# Patient Record
Sex: Female | Born: 1980 | Race: White | Hispanic: Yes | Smoking: Never smoker
Health system: Southern US, Community
[De-identification: ages and names within clinical notes are randomized; demographics above are authoritative.]

## PROBLEM LIST (undated history)

## (undated) ENCOUNTER — Inpatient Hospital Stay (HOSPITAL_COMMUNITY): Payer: Self-pay

## (undated) DIAGNOSIS — R519 Headache, unspecified: Secondary | ICD-10-CM

## (undated) DIAGNOSIS — E119 Type 2 diabetes mellitus without complications: Secondary | ICD-10-CM

## (undated) DIAGNOSIS — F32A Depression, unspecified: Secondary | ICD-10-CM

## (undated) DIAGNOSIS — Z789 Other specified health status: Secondary | ICD-10-CM

## (undated) HISTORY — DX: Other specified health status: Z78.9

---

## 2003-06-14 ENCOUNTER — Inpatient Hospital Stay (HOSPITAL_COMMUNITY): Admission: AD | Admit: 2003-06-14 | Discharge: 2003-06-16 | Payer: Self-pay | Admitting: *Deleted

## 2003-06-14 ENCOUNTER — Encounter (INDEPENDENT_AMBULATORY_CARE_PROVIDER_SITE_OTHER): Payer: Self-pay | Admitting: Specialist

## 2003-12-24 ENCOUNTER — Inpatient Hospital Stay (HOSPITAL_COMMUNITY): Admission: AD | Admit: 2003-12-24 | Discharge: 2003-12-24 | Payer: Self-pay | Admitting: Obstetrics & Gynecology

## 2003-12-29 ENCOUNTER — Inpatient Hospital Stay (HOSPITAL_COMMUNITY): Admission: AD | Admit: 2003-12-29 | Discharge: 2003-12-29 | Payer: Self-pay | Admitting: *Deleted

## 2004-04-06 ENCOUNTER — Encounter: Admission: RE | Admit: 2004-04-06 | Discharge: 2004-04-06 | Payer: Self-pay | Admitting: *Deleted

## 2004-04-21 ENCOUNTER — Encounter: Admission: RE | Admit: 2004-04-21 | Discharge: 2004-04-21 | Payer: Self-pay | Admitting: Family Medicine

## 2004-04-27 ENCOUNTER — Encounter: Admission: RE | Admit: 2004-04-27 | Discharge: 2004-04-27 | Payer: Self-pay | Admitting: *Deleted

## 2004-05-04 ENCOUNTER — Ambulatory Visit: Payer: Self-pay | Admitting: *Deleted

## 2004-05-11 ENCOUNTER — Ambulatory Visit: Payer: Self-pay | Admitting: Family Medicine

## 2004-05-18 ENCOUNTER — Ambulatory Visit: Payer: Self-pay | Admitting: *Deleted

## 2004-05-25 ENCOUNTER — Ambulatory Visit: Payer: Self-pay | Admitting: Obstetrics & Gynecology

## 2004-05-27 ENCOUNTER — Ambulatory Visit: Payer: Self-pay | Admitting: Obstetrics & Gynecology

## 2004-06-01 ENCOUNTER — Ambulatory Visit: Payer: Self-pay | Admitting: *Deleted

## 2004-06-08 ENCOUNTER — Ambulatory Visit (HOSPITAL_COMMUNITY): Admission: RE | Admit: 2004-06-08 | Discharge: 2004-06-08 | Payer: Self-pay | Admitting: *Deleted

## 2004-06-08 ENCOUNTER — Ambulatory Visit: Payer: Self-pay | Admitting: *Deleted

## 2004-06-10 ENCOUNTER — Ambulatory Visit: Payer: Self-pay | Admitting: Obstetrics and Gynecology

## 2004-06-16 ENCOUNTER — Ambulatory Visit: Payer: Self-pay | Admitting: Family Medicine

## 2004-06-23 ENCOUNTER — Ambulatory Visit: Payer: Self-pay | Admitting: *Deleted

## 2004-06-27 ENCOUNTER — Ambulatory Visit: Payer: Self-pay | Admitting: Obstetrics and Gynecology

## 2004-06-29 ENCOUNTER — Ambulatory Visit: Payer: Self-pay | Admitting: Family Medicine

## 2004-06-29 ENCOUNTER — Inpatient Hospital Stay (HOSPITAL_COMMUNITY): Admission: AD | Admit: 2004-06-29 | Discharge: 2004-06-30 | Payer: Self-pay | Admitting: *Deleted

## 2005-04-16 ENCOUNTER — Emergency Department (HOSPITAL_COMMUNITY): Admission: EM | Admit: 2005-04-16 | Discharge: 2005-04-16 | Payer: Self-pay | Admitting: Emergency Medicine

## 2009-08-08 ENCOUNTER — Emergency Department (HOSPITAL_COMMUNITY): Admission: EM | Admit: 2009-08-08 | Discharge: 2009-08-08 | Payer: Self-pay | Admitting: Emergency Medicine

## 2010-11-29 LAB — URINALYSIS, ROUTINE W REFLEX MICROSCOPIC
Glucose, UA: NEGATIVE mg/dL
Hgb urine dipstick: NEGATIVE
Ketones, ur: >80 mg/dL — AB
Nitrite: NEGATIVE
Protein, ur: 30 mg/dL — AB
Specific Gravity, Urine: 1.033 — ABNORMAL HIGH (ref 1.005–1.030)
Urobilinogen, UA: 0.2 mg/dL (ref 0.0–1.0)
pH: 6 (ref 5.0–8.0)

## 2010-11-29 LAB — PREGNANCY, URINE: Preg Test, Ur: NEGATIVE

## 2010-11-29 LAB — URINE MICROSCOPIC-ADD ON

## 2016-09-05 ENCOUNTER — Encounter (HOSPITAL_COMMUNITY): Payer: Self-pay

## 2016-09-05 ENCOUNTER — Emergency Department (HOSPITAL_COMMUNITY)
Admission: EM | Admit: 2016-09-05 | Discharge: 2016-09-06 | Disposition: A | Discharge status: Home / Self Care | Payer: Self-pay | Attending: Emergency Medicine | Admitting: Emergency Medicine

## 2016-09-05 DIAGNOSIS — O200 Threatened abortion: Secondary | ICD-10-CM | POA: Insufficient documentation

## 2016-09-05 DIAGNOSIS — Z3A08 8 weeks gestation of pregnancy: Secondary | ICD-10-CM | POA: Insufficient documentation

## 2016-09-05 LAB — I-STAT CHEM 8, ED
BUN: 12 mg/dL (ref 6–20)
Calcium, Ion: 1.19 mmol/L (ref 1.15–1.40)
Chloride: 103 mmol/L (ref 101–111)
Creatinine, Ser: 0.5 mg/dL (ref 0.44–1.00)
Glucose, Bld: 98 mg/dL (ref 65–99)
HCT: 33 % — ABNORMAL LOW (ref 36.0–46.0)
Hemoglobin: 11.2 g/dL — ABNORMAL LOW (ref 12.0–15.0)
Potassium: 4 mmol/L (ref 3.5–5.1)
Sodium: 134 mmol/L — ABNORMAL LOW (ref 135–145)
TCO2: 24 mmol/L (ref 0–100)

## 2016-09-05 LAB — TYPE AND SCREEN
ABO/RH(D): B POS
Antibody Screen: NEGATIVE

## 2016-09-05 LAB — HCG, QUANTITATIVE, PREGNANCY: hCG, Beta Chain, Quant, S: 143304 m[IU]/mL — ABNORMAL HIGH (ref <5)

## 2016-09-05 NOTE — ED Triage Notes (Signed)
Onset 7p pt felt wet, went to BR, sat on toilet and large amount of bright red blood with small clots noted.  C/o intermittant dizziness and headache.  Last intercourse, 08-22-16.

## 2016-09-05 NOTE — ED Notes (Signed)
MD at bedside. 

## 2016-09-05 NOTE — Discharge Instructions (Signed)
You were seen in the ED today with a threatened miscarriage. This means that a miscarriage may occur but you may also go on to have a normal pregnancy. Follow up with your OB/Gyn this week.   Return to the ED with any worsening bleeding, abdominal pain, or fainting.

## 2016-09-05 NOTE — ED Provider Notes (Signed)
Emergency Department Provider Note   I have reviewed the triage vital signs and the nursing notes.   HISTORY  Chief Complaint Vaginal Bleeding   HPI Nicole Little is a 36 y.o. female G5P3 who is [redacted] weeks pregnant resents to the emergency department for evaluation of sudden onset vaginal bleeding and clot passage. Symptoms began at 7 PM. She presented to the emergency department shortly afterwards after placing a pad. She is unsure if she is still bleeding but has not had to change the pattern is not felt any no fluid. She does describe some pressure in her pelvis. She did have a prior miscarriage with her first pregnancy but none since. Her only other symptom is a mild left-sided headache that occurred 2 days ago and resolved with over-the-counter medications.    History reviewed. No pertinent past medical history.  There are no active problems to display for this patient.   History reviewed. No pertinent surgical history.  Current Outpatient Rx  . Order #: [id number] Class: Historical Med    Allergies Penicillins  History reviewed. No pertinent family history.  Social History Social History  Substance Use Topics  . Smoking status: Never Smoker  . Smokeless tobacco: Never Used  . Alcohol use No    Review of Systems  Constitutional: No fever/chills Eyes: No visual changes. ENT: No sore throat. Cardiovascular: Denies chest pain. Respiratory: Denies shortness of breath. Gastrointestinal: No abdominal pain.  No nausea, no vomiting.  No diarrhea.  No constipation. Genitourinary: Negative for dysuria. Positive vaginal bleeding and pelvic pressure.  Musculoskeletal: Negative for back pain. Skin: Negative for rash. Neurological: Negative for headaches, focal weakness or numbness.  10-point ROS otherwise negative.  ____________________________________________   PHYSICAL EXAM:  VITAL SIGNS: ED Triage Vitals  Enc Vitals Group     BP 09/05/16 1940 119/62     Pulse Rate 09/05/16 1940 80     Resp 09/05/16 1940 16     Temp 09/05/16 1940 98.3 F (36.8 C)     Temp Source 09/05/16 1940 Oral     SpO2 09/05/16 1940 99 %     Pain Score 09/05/16 1943 5   Constitutional: Alert and oriented. Well appearing and in no acute distress. Eyes: Conjunctivae are normal. Head: Atraumatic. Nose: No congestion/rhinnorhea. Mouth/Throat: Mucous membranes are moist.  Oropharynx non-erythematous. Neck: No stridor.   Cardiovascular: Normal rate, regular rhythm. Good peripheral circulation. Grossly normal heart sounds.   Respiratory: Normal respiratory effort.  No retractions. Lungs CTAB. Gastrointestinal: Soft and nontender. No distention.  Genitourinary: Small amount of dark blood in vaginal vault. Cervix is visually closed. No tissue or clot visualized.  Musculoskeletal: No lower extremity tenderness nor edema. No gross deformities of extremities. Neurologic:  Normal speech and language. No gross focal neurologic deficits are appreciated.  Skin:  Skin is warm, dry and intact. No rash noted.  ____________________________________________   LABS (all labs ordered are listed, but only abnormal results are displayed)  Labs Reviewed  HCG, QUANTITATIVE, PREGNANCY - Abnormal; Notable for the following:       Result Value   hCG, Beta Chain, Quant, S 143,304 (*)    All other components within normal limits  I-STAT CHEM 8, ED - Abnormal; Notable for the following:    Sodium 134 (*)    Hemoglobin 11.2 (*)    HCT 33.0 (*)    All other components within normal limits  TYPE AND SCREEN  ABO/RH   ____________________________________________   PROCEDURES  Procedure(s) performed:  Procedures  EMERGENCY DEPARTMENT [place] PREGNANCY "Study: Limited Ultrasound of the Pelvis for Pregnancy"  INDICATIONS:Pregnancy(required) and Vaginal bleeding Multiple views of the uterus and pelvic cavity were obtained in real-time with a multi-frequency  probe.  APPROACH:Transabdominal   PERFORMED BY: Myself  IMAGES ARCHIVED?: Yes  LIMITATIONS: Body habitus  PREGNANCY FREE FLUID: None  ADNEXAL FINDINGS:Left ovary not seen and Right ovary not seen  PREGNANCY FINDINGS: Fetal pole present and Fetal heart activity seen  INTERPRETATION: Viable intrauterine pregnancy  GESTATIONAL AGE, ESTIMATE: 8 wks  CPT Codes:  40981-19 (transabdominal OB)   ____________________________________________   INITIAL IMPRESSION / ASSESSMENT AND PLAN / ED COURSE  Pertinent labs & imaging results that were available during my care of the patient were reviewed by me and considered in my medical decision making (see chart for details).  Patient who is G5 P3 [redacted] weeks pregnant presents with sudden onset vaginal bleeding with small amount of clots. She is in no acute distress with no tenderness to palpation of the abdomen. Bedside ultrasound shows an intrauterine pregnancy is approximately [redacted] weeks gestational age with positive fetal heart activity.   Patient has intrauterine pregnancy on bedside ultrasound with fetal heart activity. At this time she seems to have a threatened miscarriage. Discussed OB/GYN follow-up and return precautions in detail.  At this time, I do not feel there is any life-threatening condition present. I have reviewed and discussed all results (EKG, imaging, lab, urine as appropriate), exam findings with patient. I have reviewed nursing notes and appropriate previous records.  I feel the patient is safe to be discharged home without further emergent workup. Discussed usual and customary return precautions. Patient and family (if present) verbalize understanding and are comfortable with this plan.  Patient will follow-up with their primary care provider. If they do not have a primary care provider, information for follow-up has been provided to them. All questions have been answered.  ____________________________________________  FINAL  CLINICAL IMPRESSION(S) / ED DIAGNOSES  Final diagnoses:  Threatened miscarriage     MEDICATIONS GIVEN DURING THIS VISIT:  None  NEW OUTPATIENT MEDICATIONS STARTED DURING THIS VISIT:  None   Note:  This document was prepared using Dragon voice recognition software and may include unintentional dictation errors.  Alona Bene, MD Emergency Medicine   Maia Plan, MD 09/06/16 [phone]

## 2016-09-06 LAB — ABO/RH: ABO/RH(D): B POS

## 2016-09-12 ENCOUNTER — Emergency Department (HOSPITAL_COMMUNITY)
Admission: EM | Admit: 2016-09-12 | Discharge: 2016-09-12 | Disposition: A | Discharge status: Home / Self Care | Payer: Self-pay | Attending: Emergency Medicine | Admitting: Emergency Medicine

## 2016-09-12 DIAGNOSIS — O039 Complete or unspecified spontaneous abortion without complication: Secondary | ICD-10-CM | POA: Insufficient documentation

## 2016-09-12 LAB — CBC
HCT: 34.5 % — ABNORMAL LOW (ref 36.0–46.0)
Hemoglobin: 11.7 g/dL — ABNORMAL LOW (ref 12.0–15.0)
MCH: 29.5 pg (ref 26.0–34.0)
MCHC: 33.9 g/dL (ref 30.0–36.0)
MCV: 87.1 fL (ref 78.0–100.0)
Platelets: 225 10*3/uL (ref 150–400)
RBC: 3.96 MIL/uL (ref 3.87–5.11)
RDW: 13 % (ref 11.5–15.5)
WBC: 8.6 10*3/uL (ref 4.0–10.5)

## 2016-09-12 LAB — HCG, QUANTITATIVE, PREGNANCY: hCG, Beta Chain, Quant, S: 86509 m[IU]/mL — ABNORMAL HIGH (ref <5)

## 2016-09-12 NOTE — ED Notes (Signed)
ED Provider at bedside. 

## 2016-09-12 NOTE — ED Triage Notes (Signed)
Was seen here last week for vaginal bleeding.  [facility].  Is here for blood recheck.

## 2016-09-12 NOTE — ED Provider Notes (Signed)
[facility]-EMERGENCY DEPT Provider Note   CSN: [id number] Arrival date & time: 09/12/16  [id number]     History   Chief Complaint Chief Complaint  Patient presents with  . Follow-up    HPI Nicole Little is a 36 y.o. female.  Patient presents to the emergency department with chief complaint of needing repeat labs. She states that she was seen 1 week ago for vaginal bleeding during pregnancy. He was discharged with instructions to [facility] the next [facility], however she did not do this. She states that the bleeding has since stopped. She denies any abdominal pain. She reports having a mild amount of clear discharge from the vagina. Denies any dysuria. Denies any other associated symptoms.   The history is provided by the patient. No language interpreter was used.    No past medical history on file.  There are no active problems to display for this patient.   No past surgical history on file.  OB History    Gravida Para Term Preterm AB Living   1             SAB TAB Ectopic Multiple Live Births                   Home Medications    Prior to Admission medications   Medication Sig Start Date End Date Taking? Authorizing Provider  ferrous sulfate 325 (65 FE) MG tablet Take 325 mg by mouth daily with breakfast.    Historical Provider, MD    Family History No family history on file.  Social History Social History  Substance Use Topics  . Smoking status: Never Smoker  . Smokeless tobacco: Never Used  . Alcohol use No     Allergies   Penicillins   Review of Systems Review of Systems  All other systems reviewed and are negative.    Physical Exam Updated Vital Signs BP 109/60 (BP Location: Left Arm)   Pulse 77   Wt 59 kg   LMP 06/20/2016   SpO2 99%   Physical Exam  Constitutional: She is oriented to person, place, and time. She appears well-developed and well-nourished.  HENT:  Head: Normocephalic and atraumatic.  Eyes: Conjunctivae  and EOM are normal. Pupils are equal, round, and reactive to light.  Neck: Normal range of motion. Neck supple.  Cardiovascular: Normal rate and regular rhythm.  Exam reveals no gallop and no friction rub.   No murmur heard. Pulmonary/Chest: Effort normal and breath sounds normal. No respiratory distress. She has no wheezes. She has no rales. She exhibits no tenderness.  Abdominal: Soft. Bowel sounds are normal. She exhibits no distension and no mass. There is no tenderness. There is no rebound and no guarding.  No focal abdominal tenderness, no RLQ tenderness or pain at McBurney's point, no RUQ tenderness or Murphy's sign, no left-sided abdominal tenderness, no fluid wave, or signs of peritonitis   Musculoskeletal: Normal range of motion. She exhibits no edema or tenderness.  Neurological: She is alert and oriented to person, place, and time.  Skin: Skin is warm and dry.  Psychiatric: She has a normal mood and affect. Her behavior is normal. Judgment and thought content normal.  Nursing note and vitals reviewed.    ED Treatments / Results  Labs (all labs ordered are listed, but only abnormal results are displayed) Labs Reviewed  HCG, QUANTITATIVE, PREGNANCY - Abnormal; Notable for the following:       Result Value   hCG, Beta Chain,  [name] 16,109 (*)    All other components within normal limits  CBC - Abnormal; Notable for the following:    Hemoglobin 11.7 (*)    HCT 34.5 (*)    All other components within normal limits    EKG  EKG Interpretation None       Radiology No results found.  Procedures Procedures (including critical care time)  Medications Ordered in ED Medications - No data to display   Initial Impression / Assessment and Plan / ED Course  I have reviewed the triage vital signs and the nursing notes.  Pertinent labs & imaging results that were available during my care of the patient were reviewed by me and considered in my medical decision making (see  chart for details).  Clinical Course     Patient seen for vaginal bleeding in pregnancy last week. She was advised to follow-up at [facility] in one day for repeat testing. Ultrasound from a week ago did show an intrauterine pregnancy (see prior ED note). We will need to repeat the quantitative hCG. She has not had anymore bleeding or any abdominal pain.  HCG is trending down.  I discussed the findings with Dr. Jolayne Panther, who states that no further emergency department testing needed.  [facility] will schedule an appointment with the patient.     Final Clinical Impressions(s) / ED Diagnoses   Final diagnoses:  Miscarriage    New Prescriptions New Prescriptions   No medications on file     Roxy Horseman, PA-C 09/12/16 1119    Jerelyn Scott, MD 09/12/16 1122

## 2016-09-28 ENCOUNTER — Ambulatory Visit (INDEPENDENT_AMBULATORY_CARE_PROVIDER_SITE_OTHER): Payer: Self-pay | Admitting: Obstetrics & Gynecology

## 2016-09-28 ENCOUNTER — Other Ambulatory Visit: Payer: Self-pay | Admitting: Obstetrics & Gynecology

## 2016-09-28 ENCOUNTER — Ambulatory Visit (HOSPITAL_COMMUNITY)
Admission: RE | Admit: 2016-09-28 | Discharge: 2016-09-28 | Disposition: A | Discharge status: Home / Self Care | Payer: Self-pay | Source: Ambulatory Visit | Attending: Obstetrics & Gynecology | Admitting: Obstetrics & Gynecology

## 2016-09-28 ENCOUNTER — Encounter: Payer: Self-pay | Admitting: Obstetrics & Gynecology

## 2016-09-28 ENCOUNTER — Encounter (HOSPITAL_COMMUNITY): Payer: Self-pay

## 2016-09-28 DIAGNOSIS — O3680X0 Pregnancy with inconclusive fetal viability, not applicable or unspecified: Secondary | ICD-10-CM

## 2016-09-28 DIAGNOSIS — O209 Hemorrhage in early pregnancy, unspecified: Secondary | ICD-10-CM

## 2016-09-28 DIAGNOSIS — Z3A14 14 weeks gestation of pregnancy: Secondary | ICD-10-CM | POA: Insufficient documentation

## 2016-09-28 DIAGNOSIS — Z3A15 15 weeks gestation of pregnancy: Secondary | ICD-10-CM

## 2016-09-28 NOTE — Progress Notes (Signed)
Ultrasounds Results Note  SUBJECTIVE HPI:  Ms. Nicole Little is a 36 y.o. [id number]  Patient's last menstrual period was 06/20/2016. who [facility] for followup ultrasound results. The patient denies abdominal pain or vaginal bleeding. Results for Nicole Little, Nicole Little (MRN [id number]) as of 09/28/2016 14:05  Ref. Range 09/12/2016 09:09  HCG, Beta Chain, Quant, S Latest Ref Range: <5 mIU/mL 86,509 (H)  [facility] and [place] confirmed viable 10 week pregnancy Upon review of the patient's records, patient was first seen in ED on 1/9 for bleeding.   BHCG on that day was 143,304.  Bedside Ultrasound by the provider showed 8 week viable pregnancy by his note.  Last seen on 1/16. BHCG was [id number] .  NO vaginal bleeding or tissue passage   No past medical history on file. No past surgical history on file. Social History   Social History  . Marital status: Married    Spouse name: N/A  . Number of children: N/A  . Years of education: N/A   Occupational History  . Not on file.   Social History Main Topics  . Smoking status: Never Smoker  . Smokeless tobacco: Never Used  . Alcohol use No  . Drug use: No  . Sexual activity: Not on file   Other Topics Concern  . Not on file   Social History Narrative  . No narrative on file   Current Outpatient Prescriptions on File Prior to Visit  Medication Sig Dispense Refill  . ferrous sulfate 325 (65 FE) MG tablet Take 325 mg by mouth daily with breakfast.    . Prenatal Vit-Fe Fumarate-FA (MULTIVITAMIN-PRENATAL) 27-0.8 MG TABS tablet Take 1 tablet by mouth daily at 12 noon.     No current facility-administered medications on file prior to visit.    Allergies  Allergen Reactions  . Penicillins Rash    I have reviewed patient's Past Medical Hx, Surgical Hx, Family Hx, Social Hx, medications and allergies.   Review of Systems Review of Systems  Constitutional: Negative for fever and  chills.  Gastrointestinal: Negative for nausea, vomiting, abdominal pain, diarrhea and constipation.  Genitourinary: Negative for dysuria.  Musculoskeletal: Negative for back pain.  Neurological: Negative for dizziness and weakness.    Physical Exam  BP 107/62   Pulse 69   Wt 133 lb 6.4 oz (60.5 kg)   LMP 06/20/2016   GENERAL: Well-developed, well-nourished female in no acute distress.  HEENT: Normocephalic, atraumatic.   LUNGS: Effort normal ABDOMEN: soft, non-tender HEART: Regular rate  SKIN: Warm, dry and without erythema PSYCH: Normal mood and affect NEURO: Alert and oriented x 4  LAB RESULTS No results found for this or any previous visit (from the past 24 hour(s)).  IMAGING  EMERGENCY DEPARTMENT US PREGNANCY "Study: Limited Ultrasound of the Pelvis for Pregnancy"  INDICATIONS:Pregnancy(required) and Vaginal bleeding Multiple views of the uterus and pelvic cavity were obtained in real-time with a multi-frequency probe.  APPROACH:Transabdominal   PERFORMED BY: Myself  IMAGES ARCHIVED?: Yes  LIMITATIONS: Body habitus  PREGNANCY FREE FLUID: None  ADNEXAL FINDINGS:Left ovary not seen and Right ovary not seen  PREGNANCY FINDINGS: Fetal pole present and Fetal heart activity seen  INTERPRETATION: Viable intrauterine pregnancy  GESTATIONAL AGE, ESTIMATE: 8 wks  CPT Codes:  95621-3076815-26 (transabdominal OB)  ASSESSMENT 1. First trimester bleeding     Resolved, viable 15 week pregnancy by [place] today PLAN Schedule transfer of care per her request  Adam Phenix, MD  09/28/2016  2:02 PM

## 2016-10-05 ENCOUNTER — Ambulatory Visit (INDEPENDENT_AMBULATORY_CARE_PROVIDER_SITE_OTHER): Payer: Self-pay | Admitting: Obstetrics and Gynecology

## 2016-10-05 ENCOUNTER — Encounter: Payer: Self-pay | Admitting: Obstetrics and Gynecology

## 2016-10-05 DIAGNOSIS — O09299 Supervision of pregnancy with other poor reproductive or obstetric history, unspecified trimester: Secondary | ICD-10-CM | POA: Insufficient documentation

## 2016-10-05 DIAGNOSIS — Z8759 Personal history of other complications of pregnancy, childbirth and the puerperium: Secondary | ICD-10-CM | POA: Insufficient documentation

## 2016-10-05 DIAGNOSIS — O0990 Supervision of high risk pregnancy, unspecified, unspecified trimester: Secondary | ICD-10-CM | POA: Insufficient documentation

## 2016-10-05 DIAGNOSIS — O09292 Supervision of pregnancy with other poor reproductive or obstetric history, second trimester: Secondary | ICD-10-CM

## 2016-10-05 DIAGNOSIS — Z113 Encounter for screening for infections with a predominantly sexual mode of transmission: Secondary | ICD-10-CM

## 2016-10-05 LAB — POCT URINALYSIS DIP (DEVICE)
Bilirubin Urine: NEGATIVE
Glucose, UA: NEGATIVE mg/dL
Hgb urine dipstick: NEGATIVE
Ketones, ur: NEGATIVE mg/dL
Nitrite: NEGATIVE
Protein, ur: NEGATIVE mg/dL
Specific Gravity, Urine: 1.02 (ref 1.005–1.030)
Urobilinogen, UA: 0.2 mg/dL (ref 0.0–1.0)
pH: 5.5 (ref 5.0–8.0)

## 2016-10-05 NOTE — Patient Instructions (Signed)
Segundo trimestre de embarazo (Second Trimester of Pregnancy) El segundo trimestre va desde la semana13 hasta la 28, desde el cuarto hasta el sexto mes, y suele ser el momento en el que mejor se siente. Su organismo se ha adaptado a estar embarazada y comienza a sentirse fsicamente mejor. En general, las nuseas matutinas han disminuido o han desaparecido completamente, puede tener ms energa y un aumento de apetito. El segundo trimestre es tambin la poca en la que el feto se desarrolla rpidamente. Hacia el final del sexto mes, el feto mide aproximadamente 9pulgadas (23cm) y pesa alrededor de 1 libras (700g). Es probable que sienta que el beb se mueve (da pataditas) entre las 18 y 20semanas del embarazo. CAMBIOS EN EL ORGANISMO Su organismo atraviesa por muchos cambios durante el embarazo, y estos varan de una mujer a otra.  Seguir aumentando de peso. Notar que la parte baja del abdomen sobresale.  Podrn aparecer las primeras estras en las caderas, el abdomen y las mamas.  Es posible que tenga dolores de cabeza que pueden aliviarse con los medicamentos que el mdico le permita tomar.  Tal vez tenga necesidad de orinar con ms frecuencia porque el feto est ejerciendo presin sobre la vejiga.  Debido al embarazo podr sentir acidez estomacal con frecuencia.  Puede estar estreida, ya que ciertas hormonas enlentecen los movimientos de los msculos que empujan los desechos a travs de los intestinos.  Pueden aparecer hemorroides o abultarse e hincharse las venas (venas varicosas).  Puede tener dolor de espalda que se debe al aumento de peso y a que las hormonas del embarazo relajan las articulaciones entre los huesos de la pelvis, y como consecuencia de la modificacin del peso y los msculos que mantienen el equilibrio.  Las mamas seguirn creciendo y le dolern.  Las encas pueden sangrar y estar sensibles al cepillado y al hilo dental.  Pueden aparecer zonas oscuras o  manchas (cloasma, mscara del embarazo) en el rostro que probablemente se atenuar despus del nacimiento del beb.  Es posible que se forme una lnea oscura desde el ombligo hasta la zona del pubis (linea nigra) que probablemente se atenuar despus del nacimiento del beb.  Tal vez haya cambios en el cabello que pueden incluir su engrosamiento, crecimiento rpido y cambios en la textura. Adems, a algunas mujeres se les cae el cabello durante o despus del embarazo, o tienen el cabello seco o fino. Lo ms probable es que el cabello se le normalice despus del nacimiento del beb. QU DEBE ESPERAR EN LAS CONSULTAS PRENATALES Durante una visita prenatal de rutina:  La pesarn para asegurarse de que usted y el feto estn creciendo normalmente.  Le tomarn la presin arterial.  Le medirn el abdomen para controlar el desarrollo del beb.  Se escucharn los latidos cardacos fetales.  Se evaluarn los resultados de los estudios solicitados en visitas anteriores. El mdico puede preguntarle lo siguiente:  Cmo se siente.  Si siente los movimientos del beb.  Si ha tenido sntomas anormales, como prdida de lquido, sangrado, dolores de cabeza intensos o clicos abdominales.  Si est consumiendo algn producto que contenga tabaco, como cigarrillos, tabaco de mascar y cigarrillos electrnicos.  Si tiene alguna pregunta. Otros estudios que podrn realizarse durante el segundo trimestre incluyen lo siguiente:  Anlisis de sangre para detectar lo siguiente: ? Concentraciones de hierro bajas (anemia). ? Diabetes gestacional (entre la semana 24 y la 28). ? Anticuerpos Rh.  Anlisis de orina para detectar infecciones, diabetes o protenas en la orina.    Una ecografa para confirmar que el beb crece y se desarrolla correctamente.  Una amniocentesis para diagnosticar posibles problemas genticos.  Estudios del feto para descartar espina bfida y sndrome de Down.  Prueba del VIH (virus  de inmunodeficiencia humana). Los exmenes prenatales de rutina incluyen la prueba de deteccin del VIH, a menos que decida no realizrsela. INSTRUCCIONES PARA EL CUIDADO EN EL HOGAR  Evite fumar, consumir hierbas, beber alcohol y tomar frmacos que no le hayan recetado. Estas sustancias qumicas afectan la formacin y el desarrollo del beb.  No consuma ningn producto que contenga tabaco, lo que incluye cigarrillos, tabaco de mascar y cigarrillos electrnicos. Si necesita ayuda para dejar de fumar, consulte al mdico. Puede recibir asesoramiento y otro tipo de recursos para dejar de fumar.  Siga las indicaciones del mdico en relacin con el uso de medicamentos. Durante el embarazo, hay medicamentos que son seguros de tomar y otros que no.  Haga ejercicio solamente como se lo haya indicado el mdico. Sentir clicos uterinos es un buen signo para detener la actividad fsica.  Contine comiendo alimentos sanos con regularidad.  Use un sostn que le brinde buen soporte si le duelen las mamas.  No se d baos de inmersin en agua caliente, baos turcos ni saunas.  Use el cinturn de seguridad en todo momento mientras conduce.  No coma carne cruda ni queso sin cocinar; evite el contacto con las bandejas sanitarias de los gatos y la tierra que estos animales usan. Estos elementos contienen grmenes que pueden causar defectos congnitos en el beb.  Tome las vitaminas prenatales.  Tome entre 1500 y 2000mg de calcio diariamente comenzando en la semana20 del embarazo hasta el parto.  Si est estreida, pruebe un laxante suave (si el mdico lo autoriza). Consuma ms alimentos ricos en fibra, como vegetales y frutas frescos y cereales integrales. Beba gran cantidad de lquido para mantener la orina de tono claro o color amarillo plido.  Dese baos de asiento con agua tibia para aliviar el dolor o las molestias causadas por las hemorroides. Use una crema para las hemorroides si el mdico la  autoriza.  Si tiene venas varicosas, use medias de descanso. Eleve los pies durante 15minutos, 3 o 4veces por da. Limite el consumo de sal en su dieta.  No levante objetos pesados, use zapatos de tacones bajos y mantenga una buena postura.  Descanse con las piernas elevadas si tiene calambres o dolor de cintura.  Visite a su dentista si an no lo ha hecho durante el embarazo. Use un cepillo de dientes blando para higienizarse los dientes y psese el hilo dental con suavidad.  Puede seguir manteniendo relaciones sexuales, a menos que el mdico le indique lo contrario.  Concurra a todas las visitas prenatales segn las indicaciones de su mdico.  SOLICITE ATENCIN MDICA SI:  Tiene mareos.  Siente clicos leves, presin en la pelvis o dolor persistente en el abdomen.  Tiene nuseas, vmitos o diarrea persistentes.  Observa una secrecin vaginal con mal olor.  Siente dolor al orinar.  SOLICITE ATENCIN MDICA DE INMEDIATO SI:  Tiene fiebre.  Tiene una prdida de lquido por la vagina.  Tiene sangrado o pequeas prdidas vaginales.  Siente dolor intenso o clicos en el abdomen.  Sube o baja de peso rpidamente.  Tiene dificultad para respirar y siente dolor de pecho.  Sbitamente se le hinchan mucho el rostro, las manos, los tobillos, los pies o las piernas.  No ha sentido los movimientos del beb durante una hora.    Siente un dolor de cabeza intenso que no se alivia con medicamentos.  Su visin se modifica.  Esta informacin no tiene como fin reemplazar el consejo del mdico. Asegrese de hacerle al mdico cualquier pregunta que tenga. Document Released: 05/24/2005 Document Revised: 09/04/2014 Document Reviewed: 10/15/2012 Elsevier Interactive Patient Education  2017 [employer]  Eleccin del mtodo anticonceptivo (Contraception Choices) La anticoncepcin (control de la natalidad) es el uso de cualquier mtodo o dispositivo para evitar el embarazo. A  continuacin se indican algunos de esos mtodos. MTODOS HORMONALES  El Implante contraconceptivo consiste en un tubo plstico delgado que contiene la hormona progesterona. No contiene estrgenos. El mdico inserta el tubo en la parte interna del brazo. El tubo puede permanecer en el lugar durante 3 aos. Despus de los 3 aos debe retirarse. El implante impide que los ovarios liberen vulos (ovulacin), espesa el moco cervical, lo que evita que los espermatozoides ingresen al tero y hace ms delgada la membrana que cubre el interior del tero.  Inyecciones de progesterona sola: las administra el mdico cada 3 meses para evitar el embarazo. La progesterona sinttica impide que los ovarios liberen vulos. Tambin hacen que el moco cervical se espese y modifique el tejido de recubrimiento interno del tero. Esto hace ms difcil que los espermatozoides sobrevivan en el tero.  Las pldoras anticonceptivas contienen estrgenos y progesterona. Su funcin es evitar que los ovarios liberen vulos (ovulacin). Las hormonas de los anticonceptivos orales hacen que el moco cervical se haga ms espeso, lo que evita que el esperma ingrese al tero. Las pldoras anticonceptivas son recetadas por el mdico.Tambin se utilizan para tratar los perodos menstruales abundantes.  Minipldora: este tipo de pldora anticonceptiva contiene slo hormona progesterona. Deben tomarse todos los das del mes y debe recetarlas el mdico.  El parche de control de natalidad: contiene hormonas similares a las que contienen las pldoras anticonceptivas. Deben cambiarse una vez por semana y se utilizan bajo prescripcin mdica.  Anillo vaginal: contiene hormonas similares a las que contienen las pldoras anticonceptivas. Se deja colocado durante tres semanas, se lo retira durante 1 semana y luego se coloca uno nuevo. La paciente debe sentirse cmoda al insertar y retirar el anillo de la vagina.Es necesaria la prescripcin  mdica.  Anticonceptivos de emergencia: son mtodos para evitar un embarazo despus de una relacin sexual sin proteccin. Esta pldora puede tomarse inmediatamente despus de tener relaciones sexuales o hasta 5 das de haber tenido sexo sin proteccin. Es ms efectiva si se toma poco tiempo despus de la relacin sexual. Los anticonceptivos de emergencia estn disponibles sin prescripcin mdica. Consltelo con su farmacutico. No use los anticonceptivos de emergencia como nico mtodo anticonceptivo.  MTODOS DE BARRERA  Condn masculino: es una vaina delgada (ltex o goma) que se coloca cubriendo al pene durante el acto sexual. Puede usarse con espermicida para aumentar la efectividad.  Condn femenino. Es una funda delicada y blanda que se adapta holgadamente a la vagina antes de las relaciones sexuales.  Diafragma: es una barrera de ltex redonda y suave que debe ser recomendado por un profesional. Se inserta en la vagina, junto con un gel espermicida. Debe insertarse antes de tener relaciones sexuales. Debe dejar el diafragma colocado en la vagina durante 6 a 8 horas despus de la relacin sexual.  Capuchn cervical: es una barrera de ltex o taza plstica redonda y suave que cubre el cuello del tero y debe ser colocada por un mdico. Puede dejarlo colocado en la vagina hasta 48 horas despus de   las relaciones sexuales.  Esponja: es una pieza blanda y circular de espuma de poliuretano. Contiene un espermicida. Se inserta en la vagina despus de mojarla y antes de las relaciones sexuales.  Espermicidas: son sustancias qumicas que matan o bloquean al esperma y no lo dejan ingresar al cuello del tero y al tero. Vienen en forma de cremas, geles, supositorios, espuma o comprimidos. No es necesario tener receta mdica. Se insertan en la vagina con un aplicador antes de tener relaciones sexuales. El proceso debe repetirse cada vez que tiene relaciones sexuales.  ANTICONCEPTIVOS  INTRAUTERINOS  Dispositivo intrauterino (DIU) es un dispositivo en forma de T que se coloca en el tero durante el perodo menstrual, para evitar el embarazo. Hay dos tipos: ? DIU de cobre: este tipo de DIU est recubierto con un alambre de cobre y se inserta dentro del tero. El cobre hace que el tero y las trompas de Falopio produzcan un liquido que destruye los espermatozoides. Puede permanecer colocado durante 10 aos. ? DIU con hormona: este tipo de DIU contiene la hormona progestina (progesterona sinttica). La hormona espesa el moco cervical y evita que los espermatozoides ingresen al tero y tambin afina la membrana que cubre el tero para evitar la implantacin del vulo fertilizado. La hormona debilita o destruye los espermatozoides que ingresan al tero. Puede permanecer en el lugar durante 3-5 aos, segn el tipo de DIU que se utilice.  MTODOS ANTICONCEPTIVOS PERMANENTES  Ligadura de trompas en la mujer: se realiza sellando, atando u obstruyendo quirrgicamente las trompas de Falopio lo que impide que el vulo descienda hacia el tero.  Esterilizacin histeroscpica: Implica la colocacin de un pequeo espiral o la insercin en cada trompa de Falopio. El mdico utiliza una tcnica llamada histeroscopa para realizar este procedimiento. El dispositivo produce la formacin de tejido cicatrizal. Esto da como resultado una obstruccin permanente de las trompas de Falopio, de modo que la esperma no pueda fertilizar el vulo. Demora alrededor de 3 meses despus del procedimiento hasta que el conducto se obstruye. Tendr que usar otro mtodo anticonceptivo durante al menos 3 meses.  Esterilizacin masculina: se realiza ligando los conductos por los que pasan los espermatozoides (vasectoma).Esto impide que el esperma ingrese a la vagina durante el acto sexual. Luego del procedimiento, el hombre puede eyacular lquido (semen).  MTODOS DE PLANIFICACIN NATURAL  Planificacin familiar natural:  consiste en no tener relaciones sexuales o usar un mtodo de barrera (condn, diafragma, capuchn cervical) en los das que la mujer podra quedar embarazada.  Mtodo de calendario: consiste en el seguimiento de la duracin de cada ciclo menstrual y la identificacin de los perodos frtiles.  Mtodo de ovulacin: consiste en evitar las relaciones sexuales durante la ovulacin.  Mtodo sintotrmico: consiste en evitar las relaciones sexuales en la poca en la que se est ovulando, utilizando un termmetro y tendiendo en cuenta los sntomas de la ovulacin.  Mtodo postovulacin: consiste en planificar las relaciones sexuales para despus de haber ovulado. Independientemente del tipo o mtodo anticonceptivo que usted elija, es importante que use condones para protegerse contra las infecciones de transmisin sexual (ETS). Hable con su mdico con respecto a qu mtodo anticonceptivo es el ms apropiado para usted. Esta informacin no tiene como fin reemplazar el consejo del mdico. Asegrese de hacerle al mdico cualquier pregunta que tenga. Document Released: 08/14/2005 Document Revised: 04/16/2013 Document Reviewed: 02/06/2013 Elsevier Interactive Patient Education  2017 [employer] Inc[employer]   Lactancia materna (Breastfeeding) Decidir amamantar es una de las mejores elecciones que puede hacer   por usted y su beb. El cambio hormonal durante el embarazo produce el desarrollo del tejido mamario y aumenta la cantidad y el tamao de los conductos galactforos. Estas hormonas tambin permiten que las protenas, los azcares y las grasas de la sangre produzcan la leche materna en las glndulas productoras de leche. Las hormonas impiden que la leche materna sea liberada antes del nacimiento del beb, adems de impulsar el flujo de leche luego del nacimiento. Una vez que ha comenzado a amamantar, pensar en el beb, as como la succin o el llanto, pueden estimular la liberacin de leche de las glndulas productoras  de leche. LOS BENEFICIOS DE AMAMANTAR Para el beb  La primera leche (calostro) ayuda a mejorar el funcionamiento del sistema digestivo del beb.  La leche tiene anticuerpos que ayudan a prevenir las infecciones en el beb.  El beb tiene una menor incidencia de asma, alergias y del sndrome de muerte sbita del lactante.  Los nutrientes en la leche materna son mejores para el beb que la leche maternizada y estn preparados exclusivamente para cubrir las necesidades del beb.  La leche materna mejora el desarrollo cerebral del beb.  Es menos probable que el beb desarrolle otras enfermedades, como obesidad infantil, asma o diabetes mellitus de tipo 2. Para usted  La lactancia materna favorece el desarrollo de un vnculo muy especial entre la madre y el beb.  Es conveniente. La leche materna siempre est disponible a la temperatura correcta y es econmica.  La lactancia materna ayuda a quemar caloras y a perder el peso ganado durante el embarazo.  Favorece la contraccin del tero al tamao que tena antes del embarazo de manera ms rpida y disminuye el sangrado (loquios) despus del parto.  La lactancia materna contribuye a reducir el riesgo de desarrollar diabetes mellitus de tipo 2, osteoporosis o cncer de mama o de ovario en el futuro. SIGNOS DE QUE EL BEB EST HAMBRIENTO Primeros signos de hambre  Aumenta su estado de alerta o actividad.  Se estira.  Mueve la cabeza de un lado a otro.  Mueve la cabeza y abre la boca cuando se le toca la mejilla o la comisura de la boca (reflejo de bsqueda).  Aumenta las vocalizaciones, tales como sonidos de succin, se relame los labios, emite arrullos, suspiros, o chirridos.  Mueve la mano hacia la boca.  Se chupa con ganas los dedos o las manos. Signos tardos de hambre  Est agitado.  Llora de manera intermitente. Signos de hambre extrema Los signos de hambre extrema requerirn que lo calme y lo consuele antes de que el  beb pueda alimentarse adecuadamente. No espere a que se manifiesten los siguientes signos de hambre extrema para comenzar a amamantar:  Agitacin.  Llanto intenso y fuerte.  Gritos. INFORMACIN BSICA SOBRE LA LACTANCIA MATERNA Iniciacin de la lactancia materna  Encuentre un lugar cmodo para sentarse o acostarse, con un buen respaldo para el cuello y la espalda.  Coloque una almohada o una manta enrollada debajo del beb para acomodarlo a la altura de la mama (si est sentada). Las almohadas para amamantar se han diseado especialmente a fin de servir de apoyo para los brazos y el beb mientras amamanta.  Asegrese de que el abdomen del beb est frente al suyo.  Masajee suavemente la mama. Con las yemas de los dedos, masajee la pared del pecho hacia el pezn en un movimiento circular. Esto estimula el flujo de leche. Es posible que deba continuar este movimiento mientras amamanta si la   leche fluye lentamente.  Sostenga la mama con el pulgar por arriba del pezn y los otros 4 dedos por debajo de la mama. Asegrese de que los dedos se encuentren lejos del pezn y de la boca del beb.  Empuje suavemente los labios del beb con el pezn o con el dedo.  Cuando la boca del beb se abra lo suficiente, acrquelo rpidamente a la mama e introduzca todo el pezn y la zona oscura que lo rodea (areola), tanto como sea posible, dentro de la boca del beb. ? Debe haber ms areola visible por arriba del labio superior del beb que por debajo del labio inferior. ? La lengua del beb debe estar entre la enca inferior y la mama.  Asegrese de que la boca del beb est en la posicin correcta alrededor del pezn (prendida). Los labios del beb deben crear un sello sobre la mama y estar doblados hacia afuera (invertidos).  Es comn que el beb succione durante 2 a 3 minutos para que comience el flujo de leche materna. Cmo debe prenderse Es muy importante que le ensee al beb cmo prenderse  adecuadamente a la mama. Si el beb no se prende adecuadamente, puede causarle dolor en el pezn y reducir la produccin de leche materna, y hacer que el beb tenga un escaso aumento de peso. Adems, si el beb no se prende adecuadamente al pezn, puede tragar aire durante la alimentacin. Esto puede causarle molestias al beb. Hacer eructar al beb al cambiar de mama puede ayudarlo a liberar el aire. Sin embargo, ensearle al beb cmo prenderse a la mama adecuadamente es la mejor manera de evitar que se sienta molesto por tragar aire mientras se alimenta. Signos de que el beb se ha prendido adecuadamente al pezn:  Tironea o succiona de modo silencioso, sin causarle dolor.  Se escucha que traga cada 3 o 4 succiones.  Hay movimientos musculares por arriba y por delante de sus odos al succionar. Signos de que el beb no se ha prendido adecuadamente al pezn:  Hace ruidos de succin o de chasquido mientras se alimenta.  Siente dolor en el pezn. Si cree que el beb no se prendi correctamente, deslice el dedo en la comisura de la boca y colquelo entre las encas del beb para interrumpir la succin. Intente comenzar a amamantar nuevamente. Signos de lactancia materna exitosa Signos del beb:  Disminuye gradualmente el nmero de succiones o cesa la succin por completo.  Se duerme.  Relaja el cuerpo.  Retiene una pequea cantidad de leche en la boca.  Se desprende solo del pecho. Signos que presenta usted:  Las mamas han aumentado la firmeza, el peso y el tamao 1 a 3 horas despus de amamantar.  Estn ms blandas inmediatamente despus de amamantar.  Un aumento del volumen de leche, y tambin un cambio en su consistencia y color se producen hacia el quinto da de lactancia materna.  Los pezones no duelen, ni estn agrietados ni sangran. Signos de que su beb recibe la cantidad de leche suficiente  Mojar por lo menos 1 o 2 paales durante las primeras 24 horas despus del  nacimiento.  Mojar por lo menos 5 o 6 paales cada 24 horas durante la primera semana despus del nacimiento. La orina debe ser transparente o de color amarillo plido a los 5 das despus del nacimiento.  Mojar entre 6 y 8 paales cada 24 horas a medida que el beb sigue creciendo y desarrollndose.  Defeca al menos 3 veces en   24 horas a los 5 das de vida. La materia fecal debe ser blanda y amarillenta.  Defeca al menos 3 veces en 24 horas a los 7 das de vida. La materia fecal debe ser grumosa y amarillenta.  No registra una prdida de peso mayor del 10% del peso al nacer durante los primeros 3 das de vida.  Aumenta de peso un promedio de 4 a 7onzas (113 a 198g) por semana despus de los 4 das de vida.  Aumenta de peso, diariamente, de manera uniforme a partir de los 5 das de vida, sin registrar prdida de peso despus de las 2semanas de vida. Despus de alimentarse, es posible que el beb regurgite una pequea cantidad. Esto es frecuente. FRECUENCIA Y DURACIN DE LA LACTANCIA MATERNA El amamantamiento frecuente la ayudar a producir ms leche y a prevenir problemas de dolor en los pezones e hinchazn en las mamas. Alimente al beb cuando muestre signos de hambre o si siente la necesidad de reducir la congestin de las mamas. Esto se denomina "lactancia a demanda". Evite el uso del chupete mientras trabaja para establecer la lactancia (las primeras 4 a 6 semanas despus del nacimiento del beb). Despus de este perodo, podr ofrecerle un chupete. Las investigaciones demostraron que el uso del chupete durante el primer ao de vida del beb disminuye el riesgo de desarrollar el sndrome de muerte sbita del lactante (SMSL). Permita que el nio se alimente en cada mama todo lo que desee. Contine amamantando al beb hasta que haya terminado de alimentarse. Cuando el beb se desprende o se queda dormido mientras se est alimentando de la primera mama, ofrzcale la segunda. Debido a que, con  frecuencia, los recin nacidos permanecen somnolientos las primeras semanas de vida, es posible que deba despertar al beb para alimentarlo. Los horarios de lactancia varan de un beb a otro. Sin embargo, las siguientes reglas pueden servir como gua para ayudarla a garantizar que el beb se alimenta adecuadamente:  Se puede amamantar a los recin nacidos (bebs de 4 semanas o menos de vida) cada 1 a 3 horas.  No deben transcurrir ms de 3 horas durante el da o 5 horas durante la noche sin que se amamante a los recin nacidos.  Debe amamantar al beb 8 veces como mnimo en un perodo de 24 horas, hasta que comience a introducir slidos en su dieta, a los 6 meses de vida aproximadamente. EXTRACCIN DE LECHE MATERNA La extraccin y el almacenamiento de la leche materna le permiten asegurarse de que el beb se alimente exclusivamente de leche materna, aun en momentos en los que no puede amamantar. Esto tiene especial importancia si debe regresar al trabajo en el perodo en que an est amamantando o si no puede estar presente en los momentos en que el beb debe alimentarse. Su asesor en lactancia puede orientarla sobre cunto tiempo es seguro almacenar leche materna. El sacaleche es un aparato que le permite extraer leche de la mama a un recipiente estril. Luego, la leche materna extrada puede almacenarse en un refrigerador o congelador. Algunos sacaleches son manuales, mientras que otros son elctricos. Consulte a su asesor en lactancia qu tipo ser ms conveniente para usted. Los sacaleches se pueden comprar; sin embargo, algunos hospitales y grupos de apoyo a la lactancia materna alquilan sacaleches mensualmente. Un asesor en lactancia puede ensearle cmo extraer leche materna manualmente, en caso de que prefiera no usar un sacaleche. CMO CUIDAR LAS MAMAS DURANTE LA LACTANCIA MATERNA Los pezones se secan, agrietan y duelen   durante la lactancia materna. Las siguientes recomendaciones pueden  ayudarla a mantener las mamas humectadas y sanas:  Evite usar jabn en los pezones.  Use un sostn de soporte. Aunque no son esenciales, las camisetas sin mangas o los sostenes especiales para amamantar estn diseados para acceder fcilmente a las mamas, para amamantar sin tener que quitarse todo el sostn o la camiseta. Evite usar sostenes con aro o sostenes muy ajustados.  Seque al aire sus pezones durante 3 a 4minutos despus de amamantar al beb.  Utilice solo apsitos de algodn en el sostn para absorber las prdidas de leche. La prdida de un poco de leche materna entre las tomas es normal.  Utilice lanolina sobre los pezones luego de amamantar. La lanolina ayuda a mantener la humedad normal de la piel. Si usa lanolina pura, no tiene que lavarse los pezones antes de volver a alimentar al beb. La lanolina pura no es txica para el beb. Adems, puede extraer manualmente algunas gotas de leche materna y masajear suavemente esa leche sobre los pezones, para que la leche se seque al aire. Durante las primeras semanas despus de dar a luz, algunas mujeres pueden experimentar hinchazn en las mamas (congestin mamaria). La congestin puede hacer que sienta las mamas pesadas, calientes y sensibles al tacto. El pico de la congestin ocurre dentro de los 3 a 5 das despus del parto. Las siguientes recomendaciones pueden ayudarla a aliviar la congestin:  Vace por completo las mamas al amamantar o extraer leche. Puede aplicar calor hmedo en las mamas (en la ducha o con toallas hmedas para manos) antes de amamantar o extraer leche. Esto aumenta la circulacin y ayuda a que la leche fluya. Si el beb no vaca por completo las mamas cuando lo amamanta, extraiga la leche restante despus de que haya finalizado.  Use un sostn ajustado (para amamantar o comn) o una camiseta sin mangas durante 1 o 2 das para indicar al cuerpo que disminuya ligeramente la produccin de leche.  Aplique compresas de  hielo sobre las mamas, a menos que le resulte demasiado incmodo.  Asegrese de que el beb est prendido y se encuentre en la posicin correcta mientras lo alimenta. Si la congestin persiste luego de 48 horas o despus de seguir estas recomendaciones, comunquese con su mdico o un asesor en lactancia. RECOMENDACIONES GENERALES PARA EL CUIDADO DE LA SALUD DURANTE LA LACTANCIA MATERNA  Consuma alimentos saludables. Alterne comidas y colaciones, y coma 3 de cada una por da. Dado que lo que come afecta la leche materna, es posible que algunas comidas hagan que su beb se vuelva ms irritable de lo habitual. Evite comer este tipo de alimentos si percibe que afectan de manera negativa al beb.  Beba leche, jugos de fruta y agua para satisfacer su sed (aproximadamente 10 vasos al da).  Descanse con frecuencia, reljese y tome sus vitaminas prenatales para evitar la fatiga, el estrs y la anemia.  Contine con los autocontroles de la mama.  Evite masticar y fumar tabaco. Las sustancias qumicas de los cigarrillos que pasan a la leche materna y la exposicin al humo ambiental del tabaco pueden daar al beb.  No consuma alcohol ni drogas, incluida la marihuana. Algunos medicamentos, que pueden ser perjudiciales para el beb, pueden pasar a travs de la leche materna. Es importante que consulte a su mdico antes de tomar cualquier medicamento, incluidos todos los medicamentos recetados y de venta libre, as como los suplementos vitamnicos y herbales. Puede quedar embarazada durante la lactancia. Si   desea controlar la natalidad, consulte a su mdico cules son las opciones ms seguras para el beb. SOLICITE ATENCIN MDICA SI:  Usted siente que quiere dejar de amamantar o se siente frustrada con la lactancia.  Siente dolor en las mamas o en los pezones.  Sus pezones estn agrietados o sangran.  Sus pechos estn irritados, sensibles o calientes.  Tiene un rea hinchada en cualquiera de las  mamas.  Siente escalofros o fiebre.  Tiene nuseas o vmitos.  Presenta una secrecin de otro lquido distinto de la leche materna de los pezones.  Sus mamas no se llenan antes de amamantar al beb para el quinto da despus del parto.  Se siente triste y deprimida.  El beb est demasiado somnoliento como para comer bien.  El beb tiene problemas para dormir.  Moja menos de 3 paales en 24 horas.  Defeca menos de 3 veces en 24 horas.  La piel del beb o la parte blanca de los ojos se vuelven amarillentas.  El beb no ha aumentado de peso a los 5 das de vida.  SOLICITE ATENCIN MDICA DE INMEDIATO SI:  El beb est muy cansado (letargo) y no se quiere despertar para comer.  Le sube la fiebre sin causa.  Esta informacin no tiene como fin reemplazar el consejo del mdico. Asegrese de hacerle al mdico cualquier pregunta que tenga. Document Released: 08/14/2005 Document Revised: 12/06/2015 Document Reviewed: 02/05/2013 Elsevier Interactive Patient Education  2017 Elsevier Inc.  

## 2016-10-05 NOTE — Progress Notes (Signed)
Pap last year at [facility].

## 2016-10-05 NOTE — Progress Notes (Signed)
  Subjective:    Nicole Little is a [id number] 5963w2d being seen today for her first obstetrical visit.  Her obstetrical history is significant for first pregnancy [facility]. Patient reports SOL at 36 weeks. She states that she was told that her child passed prior to delivery and that her child passed during the delivery process. Records have been requested. Patient had 2 subsequent uncomplicated pregnancies. Patient does intend to breast feed. Pregnancy history fully reviewed.  Patient reports no complaints.  Vitals:   10/05/16 1244 10/05/16 1246  BP: (!) 110/49   Pulse: 70   Weight: 136 lb (61.7 kg)   Height:  5\' 2"  (1.575 m)    HISTORY: OB History  Gravida Para Term Preterm AB Living  4 3 2 1  0 2  SAB TAB Ectopic Multiple Live Births  0 0 0 1 2    # Outcome Date GA Lbr Len/2nd Weight Sex Delivery Anes PTL Lv  4A Gravida           4B Current           3 Term 05/08/06 7780w0d  6 lb 7 oz (2.92 kg) F    LIV  2 Term 06/29/04 2452w0d  6 lb 10 oz (3.005 kg) M Vag-Spont   LIV  1 Preterm 06/14/03 3859w3d  4 lb (1.814 kg)     FD     Past Medical History:  Diagnosis Date  . Medical history non-contributory    History reviewed. No pertinent surgical history. Family History  Problem Relation Age of Onset  . Diabetes Mother   . Diabetes Father      Exam    Uterus:   15-weeks  Pelvic Exam:    Perineum: No Hemorrhoids, Normal Perineum   Vulva: normal   Vagina:  normal mucosa, normal discharge   pH:    Cervix: multiparous appearance   Adnexa: normal adnexa and no mass, fullness, tenderness   Bony Pelvis: gynecoid  System: Breast:  normal appearance, no masses or tenderness   Skin: normal coloration and turgor, no rashes    Neurologic: oriented, no focal deficits   Extremities: normal strength, tone, and muscle mass   HEENT extra ocular movement intact   Mouth/Teeth mucous membranes moist, pharynx normal without lesions and dental hygiene good   Neck supple and no masses   Cardiovascular: regular rate and rhythm   Respiratory:  chest clear, no wheezing, crepitations, rhonchi, normal symmetric air entry   Abdomen: soft, non-tender; bowel sounds normal; no masses,  no organomegaly   Urinary:       Assessment:    Pregnancy: [id number] Patient Active Problem List   Diagnosis Date Noted  . Supervision of high-risk pregnancy 10/05/2016  . First trimester bleeding 09/28/2016        Plan:     Initial labs drawn. Prenatal vitamins. Problem list reviewed and updated. Genetic Screening discussed Quad Screen: requested.to be done at next visit  Ultrasound discussed; fetal survey: ordered. Pap smear will be requested from health department. Patient states it was normal last year  Follow up in 4 weeks. 50% of 30 min visit spent on counseling and coordination of care.     Huel Centola 10/05/2016

## 2016-10-06 LAB — PRENATAL PROFILE (SOLSTAS)
Antibody Screen: NEGATIVE
Basophils Absolute: 0 cells/uL (ref 0–200)
Basophils Relative: 0 %
Eosinophils Absolute: 80 cells/uL (ref 15–500)
Eosinophils Relative: 1 %
HCT: 33.4 % — ABNORMAL LOW (ref 35.0–45.0)
HIV 1&2 Ab, 4th Generation: NONREACTIVE
Hemoglobin: 11 g/dL — ABNORMAL LOW (ref 11.7–15.5)
Hepatitis B Surface Ag: NEGATIVE
Lymphocytes Relative: 18 %
Lymphs Abs: 1440 cells/uL (ref 850–3900)
MCH: 29.4 pg (ref 27.0–33.0)
MCHC: 32.9 g/dL (ref 32.0–36.0)
MCV: 89.3 fL (ref 80.0–100.0)
MPV: 10.4 fL (ref 7.5–12.5)
Monocytes Absolute: 480 cells/uL (ref 200–950)
Monocytes Relative: 6 %
Neutro Abs: 6000 cells/uL (ref 1500–7800)
Neutrophils Relative %: 75 %
Platelets: 245 10*3/uL (ref 140–400)
RBC: 3.74 MIL/uL — ABNORMAL LOW (ref 3.80–5.10)
RDW: 13.5 % (ref 11.0–15.0)
Rh Type: POSITIVE
Rubella: 4.27 Index — ABNORMAL HIGH (ref <0.90)
WBC: 8 10*3/uL (ref 3.8–10.8)

## 2016-10-06 LAB — CERVICOVAGINAL ANCILLARY ONLY
Chlamydia: NEGATIVE
Neisseria Gonorrhea: NEGATIVE

## 2016-10-06 LAB — PAIN MGMT, PROFILE 6 CONF W/O MM, U
6 Acetylmorphine: NEGATIVE ng/mL (ref <10)
Alcohol Metabolites: NEGATIVE ng/mL (ref <500)
Amphetamines: NEGATIVE ng/mL (ref <500)
Barbiturates: NEGATIVE ng/mL (ref <300)
Benzodiazepines: NEGATIVE ng/mL (ref <100)
Cocaine Metabolite: NEGATIVE ng/mL (ref <150)
Creatinine: 80.7 mg/dL (ref >=20.0)
Marijuana Metabolite: NEGATIVE ng/mL (ref <20)
Methadone Metabolite: NEGATIVE ng/mL (ref <100)
Opiates: NEGATIVE ng/mL (ref <100)
Oxidant: NEGATIVE ug/mL (ref <200)
Oxycodone: NEGATIVE ng/mL (ref <100)
Phencyclidine: NEGATIVE ng/mL (ref <25)
Please note:: 0
pH: 6.64 (ref 4.5–9.0)

## 2016-10-06 LAB — HEMOGLOBIN A1C
Hgb A1c MFr Bld: 5 % (ref <5.7)
Mean Plasma Glucose: 97 mg/dL

## 2016-10-06 LAB — CULTURE, OB URINE: Organism ID, Bacteria: NO GROWTH

## 2016-10-30 ENCOUNTER — Ambulatory Visit (HOSPITAL_COMMUNITY)
Admission: RE | Admit: 2016-10-30 | Discharge: 2016-10-30 | Disposition: A | Discharge status: Home / Self Care | Payer: Self-pay | Source: Ambulatory Visit | Attending: Obstetrics & Gynecology | Admitting: Obstetrics & Gynecology

## 2016-10-30 ENCOUNTER — Other Ambulatory Visit: Payer: Self-pay | Admitting: Obstetrics & Gynecology

## 2016-10-30 DIAGNOSIS — Z3689 Encounter for other specified antenatal screening: Secondary | ICD-10-CM

## 2016-10-30 DIAGNOSIS — O09299 Supervision of pregnancy with other poor reproductive or obstetric history, unspecified trimester: Secondary | ICD-10-CM

## 2016-10-30 DIAGNOSIS — O209 Hemorrhage in early pregnancy, unspecified: Secondary | ICD-10-CM

## 2016-10-30 DIAGNOSIS — Z3A18 18 weeks gestation of pregnancy: Secondary | ICD-10-CM

## 2016-10-30 DIAGNOSIS — O09292 Supervision of pregnancy with other poor reproductive or obstetric history, second trimester: Secondary | ICD-10-CM | POA: Insufficient documentation

## 2016-11-02 ENCOUNTER — Ambulatory Visit (INDEPENDENT_AMBULATORY_CARE_PROVIDER_SITE_OTHER): Payer: Self-pay | Admitting: Obstetrics and Gynecology

## 2016-11-02 VITALS — BP 115/52 | HR 78 | Wt 141.0 lb

## 2016-11-02 DIAGNOSIS — Z789 Other specified health status: Secondary | ICD-10-CM | POA: Insufficient documentation

## 2016-11-02 DIAGNOSIS — O09299 Supervision of pregnancy with other poor reproductive or obstetric history, unspecified trimester: Secondary | ICD-10-CM

## 2016-11-02 DIAGNOSIS — O09292 Supervision of pregnancy with other poor reproductive or obstetric history, second trimester: Secondary | ICD-10-CM

## 2016-11-02 DIAGNOSIS — O0992 Supervision of high risk pregnancy, unspecified, second trimester: Secondary | ICD-10-CM

## 2016-11-02 NOTE — Progress Notes (Signed)
Spanish video interpreter "Margurite Auerbachlfredo" [phone]#[phone] Pt c/o frequent headaches

## 2016-11-02 NOTE — Progress Notes (Signed)
OB US scheduled for April 12th @ 1330.  Pt notified.

## 2016-11-02 NOTE — Progress Notes (Signed)
Prenatal Visit Note Date: 11/02/2016 Clinic: [facility]  Subjective:  Nicole Little is a 36 y.o. [phone] at 5076w2d being seen today for ongoing prenatal care.  She is currently monitored for the following issues for this high-risk pregnancy and has First trimester bleeding; Supervision of high-risk pregnancy; and History of intrauterine fetal death, currently pregnant on her problem list.  Patient reports no complaints.   Contractions: Not present. Vag. Bleeding: None.  Movement: Present. Denies leaking of fluid.   The following portions of the patient's history were reviewed and updated as appropriate: allergies, current medications, past family history, past medical history, past social history, past surgical history and problem list. Problem list updated.  Objective:   Vitals:   11/02/16 1308  BP: (!) 115/52  Pulse: 78  Weight: 141 lb (64 kg)    Fetal Status: Fetal Heart Rate (bpm): 150   Movement: Present     General:  Alert, oriented and cooperative. Patient is in no acute distress.  Skin: Skin is warm and dry. No rash noted.   Cardiovascular: Normal heart rate noted  Respiratory: Normal respiratory effort, no problems with respiration noted  Abdomen: Soft, gravid, appropriate for gestational age. Pain/Pressure: Present     Pelvic:  Cervical exam deferred        Extremities: Normal range of motion.  Edema: None  Mental Status: Normal mood and affect. Normal behavior. Normal judgment and thought content.   Urinalysis:      Assessment and Plan:  Pregnancy: [id number] at 5376w2d  1. History of intrauterine fetal death, currently pregnant Notes not available. In talking to the patient, she states that she showed up to the hospital due to labor s/s (pain, no bleeding) and she states that she was told that she had an IUFD and then was told it was a NN demise. Pt brought some notes from that time and below is a summary (full details to be scanned  in[facility]  [facility] Near term intrauterine still birth due to acute chorio assoc with mec staining of membranes. Female c/w 35-36wks with no somatic or visceral anomalies. Moderate to marked generalized autolysis. Repot states weight of 250gm but death certificate gives 4lbs 13oz as weight at 36/3 weeks.   Given the autolysis, more c/w that she had an IUFD.   At this time recommend serial growth scans and start ap testing at 32wks and consider 39wk IOL  2. Supervision of high risk pregnancy in second trimester Anatomy scan neg. Quad screen today.   Interpreter used.   Preterm labor symptoms and general obstetric precautions including but not limited to vaginal bleeding, contractions, leaking of fluid and fetal movement were reviewed in detail with the patient. Please refer to After Visit Summary for other counseling recommendations.  Return in about 3 weeks (around 11/23/2016).   [place] Bingharlie Elon Lomeli, MD

## 2016-11-08 LAB — AFP, QUAD SCREEN
DIA MOM VALUE: 0.73
DIA VALUE (EIA): 144.79 pg/mL
DSR (BY AGE) 1 IN: 318
DSR (Second Trimester) 1 IN: 6400
Gestational Age: 19.3 WEEKS
MATERNAL AGE AT EDD: 34.7 a
MSAFP Mom: 1.37
MSAFP: 72.7 ng/mL
MSHCG Mom: 0.65
MSHCG: 16609 m[IU]/mL
OSB RISK: 3920
T18 (By Age): 1:1238 {titer}
TEST RESULTS AFP: NEGATIVE
UE3 VALUE: 1.84 ng/mL
Weight: 141 [lb_av]
uE3 Mom: 1.06

## 2016-11-23 ENCOUNTER — Ambulatory Visit (INDEPENDENT_AMBULATORY_CARE_PROVIDER_SITE_OTHER): Payer: Self-pay | Admitting: Family Medicine

## 2016-11-23 VITALS — BP 105/60 | HR 78 | Wt 147.0 lb

## 2016-11-23 DIAGNOSIS — O09299 Supervision of pregnancy with other poor reproductive or obstetric history, unspecified trimester: Secondary | ICD-10-CM

## 2016-11-23 DIAGNOSIS — O0992 Supervision of high risk pregnancy, unspecified, second trimester: Secondary | ICD-10-CM

## 2016-11-23 DIAGNOSIS — O09292 Supervision of pregnancy with other poor reproductive or obstetric history, second trimester: Secondary | ICD-10-CM

## 2016-11-23 NOTE — Progress Notes (Signed)
   PRENATAL VISIT NOTE  Subjective:  Nicole Little is a 36 y.o. [phone] at 7335w2d being seen today for ongoing prenatal care.  She is currently monitored for the following issues for this high-risk pregnancy and has First trimester bleeding; Supervision of high-risk pregnancy; History of intrauterine fetal death, currently pregnant; and Language barrier on her problem list.  Patient reports no complaints.  Contractions: Not present. Vag. Bleeding: None.  Movement: Present. Denies leaking of fluid.   The following portions of the patient's history were reviewed and updated as appropriate: allergies, current medications, past family history, past medical history, past social history, past surgical history and problem list. Problem list updated.  Objective:   Vitals:   11/23/16 1240  BP: 105/60  Pulse: 78  Weight: 147 lb (66.7 kg)    Fetal Status: Fetal Heart Rate (bpm): 143   Movement: Present     General:  Alert, oriented and cooperative. Patient is in no acute distress.  Skin: Skin is warm and dry. No rash noted.   Cardiovascular: Normal heart rate noted  Respiratory: Normal respiratory effort, no problems with respiration noted  Abdomen: Soft, gravid, appropriate for gestational age. Pain/Pressure: Absent     Pelvic:  Cervical exam deferred        Extremities: Normal range of motion.  Edema: None  Mental Status: Normal mood and affect. Normal behavior. Normal judgment and thought content.   Assessment and Plan:  Pregnancy: G4P2102 at 4635w2d  1. Supervision of high risk pregnancy in second trimester FH and FHT normal  2. History of intrauterine fetal death, currently pregnant Growth scans.  Preterm labor symptoms and general obstetric precautions including but not limited to vaginal bleeding, contractions, leaking of fluid and fetal movement were reviewed in detail with the patient. Please refer to After Visit Summary for other counseling recommendations.  Return in  about 4 weeks (around 12/21/2016) for HR OB f/u.   Levie HeritageJacob J Cashton Hosley, DO

## 2016-11-23 NOTE — Progress Notes (Signed)
[employer] interpreter Efraim KaufmannMelissa [phone].

## 2016-12-07 ENCOUNTER — Ambulatory Visit (HOSPITAL_COMMUNITY)
Admission: RE | Admit: 2016-12-07 | Discharge: 2016-12-07 | Disposition: A | Discharge status: Home / Self Care | Payer: Self-pay | Source: Ambulatory Visit | Attending: Obstetrics and Gynecology | Admitting: Obstetrics and Gynecology

## 2016-12-07 ENCOUNTER — Encounter (HOSPITAL_COMMUNITY): Payer: Self-pay

## 2016-12-07 ENCOUNTER — Other Ambulatory Visit (HOSPITAL_COMMUNITY): Payer: Self-pay | Admitting: *Deleted

## 2016-12-07 DIAGNOSIS — O09292 Supervision of pregnancy with other poor reproductive or obstetric history, second trimester: Secondary | ICD-10-CM | POA: Insufficient documentation

## 2016-12-07 DIAGNOSIS — O0992 Supervision of high risk pregnancy, unspecified, second trimester: Secondary | ICD-10-CM

## 2016-12-07 DIAGNOSIS — Z3A24 24 weeks gestation of pregnancy: Secondary | ICD-10-CM | POA: Insufficient documentation

## 2016-12-07 DIAGNOSIS — O09299 Supervision of pregnancy with other poor reproductive or obstetric history, unspecified trimester: Secondary | ICD-10-CM

## 2016-12-07 DIAGNOSIS — R6252 Short stature (child): Secondary | ICD-10-CM

## 2016-12-21 ENCOUNTER — Ambulatory Visit (INDEPENDENT_AMBULATORY_CARE_PROVIDER_SITE_OTHER): Payer: Self-pay | Admitting: Obstetrics and Gynecology

## 2016-12-21 VITALS — BP 104/54 | HR 73 | Wt 152.0 lb

## 2016-12-21 DIAGNOSIS — O09292 Supervision of pregnancy with other poor reproductive or obstetric history, second trimester: Secondary | ICD-10-CM

## 2016-12-21 DIAGNOSIS — Z789 Other specified health status: Secondary | ICD-10-CM

## 2016-12-21 DIAGNOSIS — O09299 Supervision of pregnancy with other poor reproductive or obstetric history, unspecified trimester: Secondary | ICD-10-CM

## 2016-12-21 DIAGNOSIS — O0992 Supervision of high risk pregnancy, unspecified, second trimester: Secondary | ICD-10-CM

## 2016-12-21 NOTE — Progress Notes (Signed)
Prenatal Visit Note Date: 12/21/2016 Clinic: [facility]  Subjective:  Nicole Little is a 36 y.o. [phone] at [redacted]w[redacted]d being seen today for ongoing prenatal care.  She is currently monitored for the following issues for this high-risk pregnancy and has First trimester bleeding; Supervision of high-risk pregnancy; History of intrauterine fetal death, currently pregnant; and Language barrier on her problem list.  Patient reports no complaints.   Contractions: Not present. Vag. Bleeding: None.  Movement: Present. Denies leaking of fluid.   The following portions of the patient's history were reviewed and updated as appropriate: allergies, current medications, past family history, past medical history, past social history, past surgical history and problem list. Problem list updated.  Objective:   Vitals:   12/21/16 1246  BP: (!) 104/54  Pulse: 73  Weight: 152 lb (68.9 kg)    Fetal Status: Fetal Heart Rate (bpm): 160 Fundal Height: 27 cm Movement: Present     General:  Alert, oriented and cooperative. Patient is in no acute distress.  Skin: Skin is warm and dry. No rash noted.   Cardiovascular: Normal heart rate noted  Respiratory: Normal respiratory effort, no problems with respiration noted  Abdomen: Soft, gravid, appropriate for gestational age. Pain/Pressure: Present     Pelvic:  Cervical exam deferred        Extremities: Normal range of motion.  Edema: Trace  Mental Status: Normal mood and affect. Normal behavior. Normal judgment and thought content.   Urinalysis:      Assessment and Plan:  Pregnancy: [id number] at [redacted]w[redacted]d  1. Supervision of high risk pregnancy in second trimester 28wk labs nv. Pt states that she thinks that when she cuts her fingers or skin that she bleeds easily. She denies any VB or spotting. Can get coags, vWD panel with 28wk labs and rpt vWD after pregnancy.   2. History of intrauterine fetal death, currently pregnant See prior  notes. Has growth already scheduled. Continue with q4wk growths and start ap testing at 32wks. Consider 39wk iol  3. Language barrier Interpreter used  Preterm labor symptoms and general obstetric precautions including but not limited to vaginal bleeding, contractions, leaking of fluid and fetal movement were reviewed in detail with the patient. Please refer to After Visit Summary for other counseling recommendations.  Return in about 2 weeks (around 01/04/2017) for rob and 2hr GTT.   [place] Bing, MD

## 2016-12-21 NOTE — Progress Notes (Signed)
Went over the benefits of breastfeeding.

## 2017-01-04 ENCOUNTER — Other Ambulatory Visit (HOSPITAL_COMMUNITY): Payer: Self-pay | Admitting: Maternal and Fetal Medicine

## 2017-01-04 ENCOUNTER — Encounter (HOSPITAL_COMMUNITY): Payer: Self-pay

## 2017-01-04 ENCOUNTER — Ambulatory Visit (HOSPITAL_COMMUNITY)
Admission: RE | Admit: 2017-01-04 | Discharge: 2017-01-04 | Disposition: A | Discharge status: Home / Self Care | Payer: Self-pay | Source: Ambulatory Visit | Attending: Obstetrics and Gynecology | Admitting: Obstetrics and Gynecology

## 2017-01-04 DIAGNOSIS — Z3A28 28 weeks gestation of pregnancy: Secondary | ICD-10-CM | POA: Insufficient documentation

## 2017-01-04 DIAGNOSIS — Z8759 Personal history of other complications of pregnancy, childbirth and the puerperium: Secondary | ICD-10-CM | POA: Insufficient documentation

## 2017-01-04 DIAGNOSIS — R6252 Short stature (child): Secondary | ICD-10-CM

## 2017-01-04 DIAGNOSIS — Z3689 Encounter for other specified antenatal screening: Secondary | ICD-10-CM

## 2017-01-04 DIAGNOSIS — O09299 Supervision of pregnancy with other poor reproductive or obstetric history, unspecified trimester: Secondary | ICD-10-CM | POA: Insufficient documentation

## 2017-01-04 DIAGNOSIS — O0992 Supervision of high risk pregnancy, unspecified, second trimester: Secondary | ICD-10-CM

## 2017-01-05 ENCOUNTER — Other Ambulatory Visit (HOSPITAL_COMMUNITY): Payer: Self-pay | Admitting: *Deleted

## 2017-01-05 DIAGNOSIS — O09293 Supervision of pregnancy with other poor reproductive or obstetric history, third trimester: Secondary | ICD-10-CM

## 2017-01-08 ENCOUNTER — Ambulatory Visit (INDEPENDENT_AMBULATORY_CARE_PROVIDER_SITE_OTHER): Payer: Self-pay | Admitting: Family Medicine

## 2017-01-08 VITALS — BP 108/58 | HR 75 | Wt 156.6 lb

## 2017-01-08 DIAGNOSIS — Z23 Encounter for immunization: Secondary | ICD-10-CM

## 2017-01-08 DIAGNOSIS — O09299 Supervision of pregnancy with other poor reproductive or obstetric history, unspecified trimester: Secondary | ICD-10-CM

## 2017-01-08 DIAGNOSIS — O09293 Supervision of pregnancy with other poor reproductive or obstetric history, third trimester: Secondary | ICD-10-CM

## 2017-01-08 DIAGNOSIS — O0993 Supervision of high risk pregnancy, unspecified, third trimester: Secondary | ICD-10-CM

## 2017-01-08 NOTE — Progress Notes (Signed)
Breastfeeding discussed with patient  

## 2017-01-08 NOTE — Progress Notes (Signed)
   PRENATAL VISIT NOTE  Subjective:  Nicole Little is a 36 y.o. [phone] at 6915w6d being seen today for ongoing prenatal care.  She is currently monitored for the following issues for this high-risk pregnancy and has First trimester bleeding; Supervision of high-risk pregnancy; History of intrauterine fetal death, currently pregnant; and Language barrier on her problem list.  Patient reports no complaints.  Contractions: Not present. Vag. Bleeding: None.  Movement: Present. Denies leaking of fluid.   The following portions of the patient's history were reviewed and updated as appropriate: allergies, current medications, past family history, past medical history, past social history, past surgical history and problem list. Problem list updated.  Objective:   Vitals:   01/08/17 0744  BP: (!) 108/58  Pulse: 75  Weight: 156 lb 9.6 oz (71 kg)    Fetal Status: Fetal Heart Rate (bpm): 142 Fundal Height: 29 cm Movement: Present     General:  Alert, oriented and cooperative. Patient is in no acute distress.  Skin: Skin is warm and dry. No rash noted.   Cardiovascular: Normal heart rate noted  Respiratory: Normal respiratory effort, no problems with respiration noted  Abdomen: Soft, gravid, appropriate for gestational age. Pain/Pressure: Present     Pelvic:  Cervical exam deferred        Extremities: Normal range of motion.  Edema: Trace  Mental Status: Normal mood and affect. Normal behavior. Normal judgment and thought content.   Assessment and Plan:  Pregnancy: [id number] at 5615w6d  1. Supervision of high risk pregnancy in third trimester 28 wk labs today - CBC - RPR - HIV antibody (with reflex) - Glucose Tolerance, 2 Hours w/1 Hour - Tdap vaccine greater than or equal to 7yo IM  2. History of intrauterine fetal death, currently pregnant U/s for growth last week at 60%--f/u in 4 wks, scheduled Need to begin 2x/wk testing at 32 wks. - CBC - RPR - HIV antibody (with  reflex) - Glucose Tolerance, 2 Hours w/1 Hour - Tdap vaccine greater than or equal to 7yo IM  Preterm labor symptoms and general obstetric precautions including but not limited to vaginal bleeding, contractions, leaking of fluid and fetal movement were reviewed in detail with the patient. Please refer to After Visit Summary for other counseling recommendations.  Return in 2 weeks (on 01/22/2017).   Reva BoresPratt, Amer Alcindor S, MD

## 2017-01-08 NOTE — Patient Instructions (Signed)
Tercer trimestre de embarazo (Third Trimester of Pregnancy) El tercer trimestre comprende desde la semana29 hasta la semana42, es decir, desde el mes7 hasta el mes9. El tercer trimestre es un perodo en el que el feto crece rpidamente. Hacia el final del noveno mes, el feto mide alrededor de 20pulgadas (45cm) de largo y pesa entre 6 y 10 libras (2,700 y 4,500kg). CAMBIOS EN EL ORGANISMO Su organismo atraviesa por muchos cambios durante el embarazo, y estos varan de una mujer a otra.  Seguir aumentando de peso. Es de esperar que aumente entre 25 y 35libras (11 y 16kg) hacia el final del embarazo.  Podrn aparecer las primeras estras en las caderas, el abdomen y las mamas.  Puede tener necesidad de orinar con ms frecuencia porque el feto baja hacia la pelvis y ejerce presin sobre la vejiga.  Debido al embarazo podr sentir acidez estomacal con frecuencia.  Puede estar estreida, ya que ciertas hormonas enlentecen los movimientos de los msculos que empujan los desechos a travs de los intestinos.  Pueden aparecer hemorroides o abultarse e hincharse las venas (venas varicosas).  Puede sentir dolor plvico debido al aumento de peso y a que las hormonas del embarazo relajan las articulaciones entre los huesos de la pelvis. El dolor de espalda puede ser consecuencia de la sobrecarga de los msculos que soportan la postura.  Tal vez haya cambios en el cabello que pueden incluir su engrosamiento, crecimiento rpido y cambios en la textura. Adems, a algunas mujeres se les cae el cabello durante o despus del embarazo, o tienen el cabello seco o fino. Lo ms probable es que el cabello se le normalice despus del nacimiento del beb.  Las mamas seguirn creciendo y le dolern. A veces, puede haber una secrecin amarilla de las mamas llamada calostro.  El ombligo puede salir hacia afuera.  Puede sentir que le falta el aire debido a que se expande el tero.  Puede notar que el feto  "baja" o lo siente ms bajo, en el abdomen.  Puede tener una prdida de secrecin mucosa con sangre. Esto suele ocurrir en el trmino de unos pocos das a una semana antes de que comience el trabajo de parto.  El cuello del tero se vuelve delgado y blando (se borra) cerca de la fecha de parto. QU DEBE ESPERAR EN LOS EXMENES PRENATALES Le harn exmenes prenatales cada 2semanas hasta la semana36. A partir de ese momento le harn exmenes semanales. Durante una visita prenatal de rutina:  La pesarn para asegurarse de que usted y el feto estn creciendo normalmente.  Le tomarn la presin arterial.  Le medirn el abdomen para controlar el desarrollo del beb.  Se escucharn los latidos cardacos fetales.  Se evaluarn los resultados de los estudios solicitados en visitas anteriores.  Le revisarn el cuello del tero cuando est prxima la fecha de parto para controlar si este se ha borrado. Alrededor de la semana36, el mdico le revisar el cuello del tero. Al mismo tiempo, realizar un anlisis de las secreciones del tejido vaginal. Este examen es para determinar si hay un tipo de bacteria, estreptococo Grupo B. El mdico le explicar esto con ms detalle. El mdico puede preguntarle lo siguiente:  Cmo le gustara que fuera el parto.  Cmo se siente.  Si siente los movimientos del beb.  Si ha tenido sntomas anormales, como prdida de lquido, sangrado, dolores de cabeza intensos o clicos abdominales.  Si est consumiendo algn producto que contenga tabaco, como cigarrillos, tabaco de mascar y   cigarrillos electrnicos.  Si tiene alguna pregunta. Otros exmenes o estudios de deteccin que pueden realizarse durante el tercer trimestre incluyen lo siguiente:  Anlisis de sangre para controlar los niveles de hierro (anemia).  Controles fetales para determinar su salud, nivel de actividad y crecimiento. Si tiene alguna enfermedad o hay problemas durante el embarazo, le harn  estudios.  Prueba del VIH (virus de inmunodeficiencia humana). Si corre un riesgo alto, pueden realizarle una prueba de deteccin del VIH durante el tercer trimestre del embarazo. FALSO TRABAJO DE PARTO Es posible que sienta contracciones leves e irregulares que finalmente desaparecen. Se llaman contracciones de Braxton Hicks o falso trabajo de parto. Las contracciones pueden durar horas, das o incluso semanas, antes de que el verdadero trabajo de parto se inicie. Si las contracciones ocurren a intervalos regulares, se intensifican o se hacen dolorosas, lo mejor es que la revise el mdico. SIGNOS DE TRABAJO DE PARTO  Clicos de tipo menstrual.  Contracciones cada 5minutos o menos.  Contracciones que comienzan en la parte superior del tero y se extienden hacia abajo, a la zona inferior del abdomen y la espalda.  Sensacin de mayor presin en la pelvis o dolor de espalda.  Una secrecin de mucosidad acuosa o con sangre que sale de la vagina. Si tiene alguno de estos signos antes de la semana37 del embarazo, llame a su mdico de inmediato. Debe concurrir al hospital para que la controlen inmediatamente. INSTRUCCIONES PARA EL CUIDADO EN EL HOGAR  Evite fumar, consumir hierbas, beber alcohol y tomar frmacos que no le hayan recetado. Estas sustancias qumicas afectan la formacin y el desarrollo del beb.  No consuma ningn producto que contenga tabaco, lo que incluye cigarrillos, tabaco de mascar y cigarrillos electrnicos. Si necesita ayuda para dejar de fumar, consulte al mdico. Puede recibir asesoramiento y otro tipo de recursos para dejar de fumar.  Siga las indicaciones del mdico en relacin con el uso de medicamentos. Durante el embarazo, hay medicamentos que son seguros de tomar y otros que no.  Haga ejercicio solamente como se lo haya indicado el mdico. Sentir clicos uterinos es un buen signo para detener la actividad fsica.  Contine comiendo alimentos sanos con  regularidad.  Use un sostn que le brinde buen soporte si le duelen las mamas.  No se d baos de inmersin en agua caliente, baos turcos ni saunas.  Use el cinturn de seguridad en todo momento mientras conduce.  No coma carne cruda ni queso sin cocinar; evite el contacto con las bandejas sanitarias de los gatos y la tierra que estos animales usan. Estos elementos contienen grmenes que pueden causar defectos congnitos en el beb.  Tome las vitaminas prenatales.  Tome entre 1500 y 2000mg de calcio diariamente comenzando en la semana20 del embarazo hasta el parto.  Si est estreida, pruebe un laxante suave (si el mdico lo autoriza). Consuma ms alimentos ricos en fibra, como vegetales y frutas frescos y cereales integrales. Beba gran cantidad de lquido para mantener la orina de tono claro o color amarillo plido.  Dese baos de asiento con agua tibia para aliviar el dolor o las molestias causadas por las hemorroides. Use una crema para las hemorroides si el mdico la autoriza.  Si tiene venas varicosas, use medias de descanso. Eleve los pies durante 15minutos, 3 o 4veces por da. Limite el consumo de sal en su dieta.  Evite levantar objetos pesados, use zapatos de tacones bajos y mantenga una buena postura.  Descanse con las piernas elevadas si tiene   calambres o dolor de cintura.  Visite a su dentista si no lo ha hecho durante el embarazo. Use un cepillo de dientes blando para higienizarse los dientes y psese el hilo dental con suavidad.  Puede seguir manteniendo relaciones sexuales, a menos que el mdico le indique lo contrario.  No haga viajes largos excepto que sea absolutamente necesario y solo con la autorizacin del mdico.  Tome clases prenatales para entender, practicar y hacer preguntas sobre el trabajo de parto y el parto.  Haga un ensayo de la partida al hospital.  Prepare el bolso que llevar al hospital.  Prepare la habitacin del beb.  Concurra a todas  las visitas prenatales segn las indicaciones de su mdico.  SOLICITE ATENCIN MDICA SI:  No est segura de que est en trabajo de parto o de que ha roto la bolsa de las aguas.  Tiene mareos.  Siente clicos leves, presin en la pelvis o dolor persistente en el abdomen.  Tiene nuseas, vmitos o diarrea persistentes.  Observa una secrecin vaginal con mal olor.  Siente dolor al orinar.  SOLICITE ATENCIN MDICA DE INMEDIATO SI:  Tiene fiebre.  Tiene una prdida de lquido por la vagina.  Tiene sangrado o pequeas prdidas vaginales.  Siente dolor intenso o clicos en el abdomen.  Sube o baja de peso rpidamente.  Tiene dificultad para respirar y siente dolor de pecho.  Sbitamente se le hinchan mucho el rostro, las manos, los tobillos, los pies o las piernas.  No ha sentido los movimientos del beb durante una hora.  Siente un dolor de cabeza intenso que no se alivia con medicamentos.  Su visin se modifica.  Esta informacin no tiene como fin reemplazar el consejo del mdico. Asegrese de hacerle al mdico cualquier pregunta que tenga. Document Released: 05/24/2005 Document Revised: 09/04/2014 Document Reviewed: 10/15/2012 Elsevier Interactive Patient Education  2017 [employer]   Lactancia materna (Breastfeeding) Decidir amamantar es una de las mejores elecciones que puede hacer por usted y su beb. El cambio hormonal durante el embarazo produce el desarrollo del tejido mamario y aumenta la cantidad y el tamao de los conductos galactforos. Estas hormonas tambin permiten que las protenas, los azcares y las grasas de la sangre produzcan la leche materna en las glndulas productoras de leche. Las hormonas impiden que la leche materna sea liberada antes del nacimiento del beb, adems de impulsar el flujo de leche luego del nacimiento. Una vez que ha comenzado a amamantar, pensar en el beb, as como la succin o el llanto, pueden estimular la liberacin de leche  de las glndulas productoras de leche. LOS BENEFICIOS DE AMAMANTAR Para el beb  La primera leche (calostro) ayuda a mejorar el funcionamiento del sistema digestivo del beb.  La leche tiene anticuerpos que ayudan a prevenir las infecciones en el beb.  El beb tiene una menor incidencia de asma, alergias y del sndrome de muerte sbita del lactante.  Los nutrientes en la leche materna son mejores para el beb que la leche maternizada y estn preparados exclusivamente para cubrir las necesidades del beb.  La leche materna mejora el desarrollo cerebral del beb.  Es menos probable que el beb desarrolle otras enfermedades, como obesidad infantil, asma o diabetes mellitus de tipo 2. Para usted  La lactancia materna favorece el desarrollo de un vnculo muy especial entre la madre y el beb.  Es conveniente. La leche materna siempre est disponible a la temperatura correcta y es econmica.  La lactancia materna ayuda a quemar caloras y   a perder el peso ganado durante el embarazo.  Favorece la contraccin del tero al tamao que tena antes del embarazo de manera ms rpida y disminuye el sangrado (loquios) despus del parto.  La lactancia materna contribuye a reducir el riesgo de desarrollar diabetes mellitus de tipo 2, osteoporosis o cncer de mama o de ovario en el futuro. SIGNOS DE QUE EL BEB EST HAMBRIENTO Primeros signos de hambre  Aumenta su estado de alerta o actividad.  Se estira.  Mueve la cabeza de un lado a otro.  Mueve la cabeza y abre la boca cuando se le toca la mejilla o la comisura de la boca (reflejo de bsqueda).  Aumenta las vocalizaciones, tales como sonidos de succin, se relame los labios, emite arrullos, suspiros, o chirridos.  Mueve la mano hacia la boca.  Se chupa con ganas los dedos o las manos. Signos tardos de hambre  Est agitado.  Llora de manera intermitente. Signos de hambre extrema Los signos de hambre extrema requerirn que lo calme y  lo consuele antes de que el beb pueda alimentarse adecuadamente. No espere a que se manifiesten los siguientes signos de hambre extrema para comenzar a amamantar:  Agitacin.  Llanto intenso y fuerte.  Gritos. INFORMACIN BSICA SOBRE LA LACTANCIA MATERNA Iniciacin de la lactancia materna  Encuentre un lugar cmodo para sentarse o acostarse, con un buen respaldo para el cuello y la espalda.  Coloque una almohada o una manta enrollada debajo del beb para acomodarlo a la altura de la mama (si est sentada). Las almohadas para amamantar se han diseado especialmente a fin de servir de apoyo para los brazos y el beb mientras amamanta.  Asegrese de que el abdomen del beb est frente al suyo.  Masajee suavemente la mama. Con las yemas de los dedos, masajee la pared del pecho hacia el pezn en un movimiento circular. Esto estimula el flujo de leche. Es posible que deba continuar este movimiento mientras amamanta si la leche fluye lentamente.  Sostenga la mama con el pulgar por arriba del pezn y los otros 4 dedos por debajo de la mama. Asegrese de que los dedos se encuentren lejos del pezn y de la boca del beb.  Empuje suavemente los labios del beb con el pezn o con el dedo.  Cuando la boca del beb se abra lo suficiente, acrquelo rpidamente a la mama e introduzca todo el pezn y la zona oscura que lo rodea (areola), tanto como sea posible, dentro de la boca del beb. ? Debe haber ms areola visible por arriba del labio superior del beb que por debajo del labio inferior. ? La lengua del beb debe estar entre la enca inferior y la mama.  Asegrese de que la boca del beb est en la posicin correcta alrededor del pezn (prendida). Los labios del beb deben crear un sello sobre la mama y estar doblados hacia afuera (invertidos).  Es comn que el beb succione durante 2 a 3 minutos para que comience el flujo de leche materna. Cmo debe prenderse Es muy importante que le ensee al  beb cmo prenderse adecuadamente a la mama. Si el beb no se prende adecuadamente, puede causarle dolor en el pezn y reducir la produccin de leche materna, y hacer que el beb tenga un escaso aumento de peso. Adems, si el beb no se prende adecuadamente al pezn, puede tragar aire durante la alimentacin. Esto puede causarle molestias al beb. Hacer eructar al beb al cambiar de mama puede ayudarlo a liberar el   aire. Sin embargo, ensearle al beb cmo prenderse a la mama adecuadamente es la mejor manera de evitar que se sienta molesto por tragar aire mientras se alimenta. Signos de que el beb se ha prendido adecuadamente al pezn:  Tironea o succiona de modo silencioso, sin causarle dolor.  Se escucha que traga cada 3 o 4 succiones.  Hay movimientos musculares por arriba y por delante de sus odos al succionar. Signos de que el beb no se ha prendido adecuadamente al pezn:  Hace ruidos de succin o de chasquido mientras se alimenta.  Siente dolor en el pezn. Si cree que el beb no se prendi correctamente, deslice el dedo en la comisura de la boca y colquelo entre las encas del beb para interrumpir la succin. Intente comenzar a amamantar nuevamente. Signos de lactancia materna exitosa Signos del beb:  Disminuye gradualmente el nmero de succiones o cesa la succin por completo.  Se duerme.  Relaja el cuerpo.  Retiene una pequea cantidad de leche en la boca.  Se desprende solo del pecho. Signos que presenta usted:  Las mamas han aumentado la firmeza, el peso y el tamao 1 a 3 horas despus de amamantar.  Estn ms blandas inmediatamente despus de amamantar.  Un aumento del volumen de leche, y tambin un cambio en su consistencia y color se producen hacia el quinto da de lactancia materna.  Los pezones no duelen, ni estn agrietados ni sangran. Signos de que su beb recibe la cantidad de leche suficiente  Mojar por lo menos 1 o 2 paales durante las primeras 24  horas despus del nacimiento.  Mojar por lo menos 5 o 6 paales cada 24 horas durante la primera semana despus del nacimiento. La orina debe ser transparente o de color amarillo plido a los 5 das despus del nacimiento.  Mojar entre 6 y 8 paales cada 24 horas a medida que el beb sigue creciendo y desarrollndose.  Defeca al menos 3 veces en 24 horas a los 5 das de vida. La materia fecal debe ser blanda y amarillenta.  Defeca al menos 3 veces en 24 horas a los 7 das de vida. La materia fecal debe ser grumosa y amarillenta.  No registra una prdida de peso mayor del 10% del peso al nacer durante los primeros 3 das de vida.  Aumenta de peso un promedio de 4 a 7onzas (113 a 198g) por semana despus de los 4 das de vida.  Aumenta de peso, diariamente, de manera uniforme a partir de los 5 das de vida, sin registrar prdida de peso despus de las 2semanas de vida. Despus de alimentarse, es posible que el beb regurgite una pequea cantidad. Esto es frecuente. FRECUENCIA Y DURACIN DE LA LACTANCIA MATERNA El amamantamiento frecuente la ayudar a producir ms leche y a prevenir problemas de dolor en los pezones e hinchazn en las mamas. Alimente al beb cuando muestre signos de hambre o si siente la necesidad de reducir la congestin de las mamas. Esto se denomina "lactancia a demanda". Evite el uso del chupete mientras trabaja para establecer la lactancia (las primeras 4 a 6 semanas despus del nacimiento del beb). Despus de este perodo, podr ofrecerle un chupete. Las investigaciones demostraron que el uso del chupete durante el primer ao de vida del beb disminuye el riesgo de desarrollar el sndrome de muerte sbita del lactante (SMSL). Permita que el nio se alimente en cada mama todo lo que desee. Contine amamantando al beb hasta que haya terminado de alimentarse. Cuando   el beb se desprende o se queda dormido mientras se est alimentando de la primera mama, ofrzcale la segunda.  Debido a que, con frecuencia, los recin nacidos permanecen somnolientos las primeras semanas de vida, es posible que deba despertar al beb para alimentarlo. Los horarios de lactancia varan de un beb a otro. Sin embargo, las siguientes reglas pueden servir como gua para ayudarla a garantizar que el beb se alimenta adecuadamente:  Se puede amamantar a los recin nacidos (bebs de 4 semanas o menos de vida) cada 1 a 3 horas.  No deben transcurrir ms de 3 horas durante el da o 5 horas durante la noche sin que se amamante a los recin nacidos.  Debe amamantar al beb 8 veces como mnimo en un perodo de 24 horas, hasta que comience a introducir slidos en su dieta, a los 6 meses de vida aproximadamente. EXTRACCIN DE LECHE MATERNA La extraccin y el almacenamiento de la leche materna le permiten asegurarse de que el beb se alimente exclusivamente de leche materna, aun en momentos en los que no puede amamantar. Esto tiene especial importancia si debe regresar al trabajo en el perodo en que an est amamantando o si no puede estar presente en los momentos en que el beb debe alimentarse. Su asesor en lactancia puede orientarla sobre cunto tiempo es seguro almacenar leche materna. El sacaleche es un aparato que le permite extraer leche de la mama a un recipiente estril. Luego, la leche materna extrada puede almacenarse en un refrigerador o congelador. Algunos sacaleches son manuales, mientras que otros son elctricos. Consulte a su asesor en lactancia qu tipo ser ms conveniente para usted. Los sacaleches se pueden comprar; sin embargo, algunos hospitales y grupos de apoyo a la lactancia materna alquilan sacaleches mensualmente. Un asesor en lactancia puede ensearle cmo extraer leche materna manualmente, en caso de que prefiera no usar un sacaleche. CMO CUIDAR LAS MAMAS DURANTE LA LACTANCIA MATERNA Los pezones se secan, agrietan y duelen durante la lactancia materna. Las siguientes  recomendaciones pueden ayudarla a mantener las mamas humectadas y sanas:  Evite usar jabn en los pezones.  Use un sostn de soporte. Aunque no son esenciales, las camisetas sin mangas o los sostenes especiales para amamantar estn diseados para acceder fcilmente a las mamas, para amamantar sin tener que quitarse todo el sostn o la camiseta. Evite usar sostenes con aro o sostenes muy ajustados.  Seque al aire sus pezones durante 3 a 4minutos despus de amamantar al beb.  Utilice solo apsitos de algodn en el sostn para absorber las prdidas de leche. La prdida de un poco de leche materna entre las tomas es normal.  Utilice lanolina sobre los pezones luego de amamantar. La lanolina ayuda a mantener la humedad normal de la piel. Si usa lanolina pura, no tiene que lavarse los pezones antes de volver a alimentar al beb. La lanolina pura no es txica para el beb. Adems, puede extraer manualmente algunas gotas de leche materna y masajear suavemente esa leche sobre los pezones, para que la leche se seque al aire. Durante las primeras semanas despus de dar a luz, algunas mujeres pueden experimentar hinchazn en las mamas (congestin mamaria). La congestin puede hacer que sienta las mamas pesadas, calientes y sensibles al tacto. El pico de la congestin ocurre dentro de los 3 a 5 das despus del parto. Las siguientes recomendaciones pueden ayudarla a aliviar la congestin:  Vace por completo las mamas al amamantar o extraer leche. Puede aplicar calor hmedo en las mamas (  en la ducha o con toallas hmedas para manos) antes de amamantar o extraer leche. Esto aumenta la circulacin y ayuda a que la leche fluya. Si el beb no vaca por completo las mamas cuando lo amamanta, extraiga la leche restante despus de que haya finalizado.  Use un sostn ajustado (para amamantar o comn) o una camiseta sin mangas durante 1 o 2 das para indicar al cuerpo que disminuya ligeramente la produccin de  leche.  Aplique compresas de hielo sobre las mamas, a menos que le resulte demasiado incmodo.  Asegrese de que el beb est prendido y se encuentre en la posicin correcta mientras lo alimenta. Si la congestin persiste luego de 48 horas o despus de seguir estas recomendaciones, comunquese con su mdico o un asesor en lactancia. RECOMENDACIONES GENERALES PARA EL CUIDADO DE LA SALUD DURANTE LA LACTANCIA MATERNA  Consuma alimentos saludables. Alterne comidas y colaciones, y coma 3 de cada una por da. Dado que lo que come afecta la leche materna, es posible que algunas comidas hagan que su beb se vuelva ms irritable de lo habitual. Evite comer este tipo de alimentos si percibe que afectan de manera negativa al beb.  Beba leche, jugos de fruta y agua para satisfacer su sed (aproximadamente 10 vasos al da).  Descanse con frecuencia, reljese y tome sus vitaminas prenatales para evitar la fatiga, el estrs y la anemia.  Contine con los autocontroles de la mama.  Evite masticar y fumar tabaco. Las sustancias qumicas de los cigarrillos que pasan a la leche materna y la exposicin al humo ambiental del tabaco pueden daar al beb.  No consuma alcohol ni drogas, incluida la marihuana. Algunos medicamentos, que pueden ser perjudiciales para el beb, pueden pasar a travs de la leche materna. Es importante que consulte a su mdico antes de tomar cualquier medicamento, incluidos todos los medicamentos recetados y de venta libre, as como los suplementos vitamnicos y herbales. Puede quedar embarazada durante la lactancia. Si desea controlar la natalidad, consulte a su mdico cules son las opciones ms seguras para el beb. SOLICITE ATENCIN MDICA SI:  Usted siente que quiere dejar de amamantar o se siente frustrada con la lactancia.  Siente dolor en las mamas o en los pezones.  Sus pezones estn agrietados o sangran.  Sus pechos estn irritados, sensibles o calientes.  Tiene un rea  hinchada en cualquiera de las mamas.  Siente escalofros o fiebre.  Tiene nuseas o vmitos.  Presenta una secrecin de otro lquido distinto de la leche materna de los pezones.  Sus mamas no se llenan antes de amamantar al beb para el quinto da despus del parto.  Se siente triste y deprimida.  El beb est demasiado somnoliento como para comer bien.  El beb tiene problemas para dormir.  Moja menos de 3 paales en 24 horas.  Defeca menos de 3 veces en 24 horas.  La piel del beb o la parte blanca de los ojos se vuelven amarillentas.  El beb no ha aumentado de peso a los 5 das de vida.  SOLICITE ATENCIN MDICA DE INMEDIATO SI:  El beb est muy cansado (letargo) y no se quiere despertar para comer.  Le sube la fiebre sin causa.  Esta informacin no tiene como fin reemplazar el consejo del mdico. Asegrese de hacerle al mdico cualquier pregunta que tenga. Document Released: 08/14/2005 Document Revised: 12/06/2015 Document Reviewed: 02/05/2013 Elsevier Interactive Patient Education  2017 [employer]  

## 2017-01-09 LAB — CBC
HEMATOCRIT: 32.1 % — AB (ref 34.0–46.6)
HEMOGLOBIN: 10.6 g/dL — AB (ref 11.1–15.9)
MCH: 28.8 pg (ref 26.6–33.0)
MCHC: 33 g/dL (ref 31.5–35.7)
MCV: 87 fL (ref 79–97)
Platelets: 250 10*3/uL (ref 150–379)
RBC: 3.68 x10E6/uL — ABNORMAL LOW (ref 3.77–5.28)
RDW: 13.7 % (ref 12.3–15.4)
WBC: 9 10*3/uL (ref 3.4–10.8)

## 2017-01-09 LAB — HIV ANTIBODY (ROUTINE TESTING W REFLEX): HIV Screen 4th Generation wRfx: NONREACTIVE

## 2017-01-09 LAB — GLUCOSE TOLERANCE, 2 HOURS W/ 1HR
GLUCOSE, FASTING: 84 mg/dL (ref 65–91)
Glucose, 1 hour: 117 mg/dL (ref 65–179)
Glucose, 2 hour: 108 mg/dL (ref 65–152)

## 2017-01-09 LAB — RPR: RPR: NONREACTIVE

## 2017-01-17 LAB — GLUCOSE, POCT (MANUAL RESULT ENTRY): POC GLUCOSE: 92 mg/dL (ref 70–99)

## 2017-01-26 ENCOUNTER — Ambulatory Visit (INDEPENDENT_AMBULATORY_CARE_PROVIDER_SITE_OTHER): Payer: Self-pay | Admitting: Family Medicine

## 2017-01-26 VITALS — BP 108/55 | HR 81 | Wt 161.4 lb

## 2017-01-26 DIAGNOSIS — Z789 Other specified health status: Secondary | ICD-10-CM

## 2017-01-26 DIAGNOSIS — O0993 Supervision of high risk pregnancy, unspecified, third trimester: Secondary | ICD-10-CM

## 2017-01-26 DIAGNOSIS — O09299 Supervision of pregnancy with other poor reproductive or obstetric history, unspecified trimester: Secondary | ICD-10-CM

## 2017-01-26 DIAGNOSIS — O09293 Supervision of pregnancy with other poor reproductive or obstetric history, third trimester: Secondary | ICD-10-CM

## 2017-01-26 MED ORDER — RANITIDINE HCL 150 MG PO TABS: 150.0000 mg | ORAL_TABLET | Freq: Two times a day (BID) | ORAL | 3 refills | Status: DC

## 2017-01-26 NOTE — Progress Notes (Signed)
   PRENATAL VISIT NOTE  Subjective:  Lacie DraftFebe E Kathi DerRodriguez Chicas is a 36 y.o. [phone] at 3135w3d being seen today for ongoing prenatal care.  She is currently monitored for the following issues for this high-risk pregnancy and has Supervision of high-risk pregnancy; History of intrauterine fetal death, currently pregnant; and Language barrier on her problem list.  Patient reports heartburn. Mild pelvic pressure, no contractions.  Contractions: Not present. Vag. Bleeding: None.  Movement: Present. Denies leaking of fluid.   The following portions of the patient's history were reviewed and updated as appropriate: allergies, current medications, past family history, past medical history, past social history, past surgical history and problem list. Problem list updated.  Objective:   Vitals:   01/26/17 0946  BP: (!) 108/55  Pulse: 81  Weight: 161 lb 6.4 oz (73.2 kg)    Fetal Status: Fetal Heart Rate (bpm): 150 Fundal Height: 31 cm Movement: Present     General:  Alert, oriented and cooperative. Patient is in no acute distress.  Skin: Skin is warm and dry. No rash noted.   Cardiovascular: Normal heart rate noted  Respiratory: Normal respiratory effort, no problems with respiration noted  Abdomen: Soft, gravid, appropriate for gestational age. Pain/Pressure: Present     Pelvic:  Cervical exam deferred        Extremities: Normal range of motion.  Edema: Trace  Mental Status: Normal mood and affect. Normal behavior. Normal judgment and thought content.   Assessment and Plan:  Pregnancy: [id number] at 9935w3d  1. Supervision of high risk pregnancy in third trimester FHT and FH normal  2. History of intrauterine fetal death, currently pregnant NST twice weekly starting next week. Continue US for growth.  3. Language barrier Interpreter used.  Preterm labor symptoms and general obstetric precautions including but not limited to vaginal bleeding, contractions, leaking of fluid and fetal movement  were reviewed in detail with the patient. Please refer to After Visit Summary for other counseling recommendations.  No Follow-up on file.   Levie HeritageJacob J Ameir Faria, DO

## 2017-01-26 NOTE — Progress Notes (Signed)
States all of pregnancy has had some edema in both legs but states since Sunday alittle more.

## 2017-01-30 ENCOUNTER — Ambulatory Visit (INDEPENDENT_AMBULATORY_CARE_PROVIDER_SITE_OTHER): Payer: Self-pay | Admitting: *Deleted

## 2017-01-30 VITALS — BP 108/58 | HR 72

## 2017-01-30 DIAGNOSIS — O09293 Supervision of pregnancy with other poor reproductive or obstetric history, third trimester: Secondary | ICD-10-CM

## 2017-01-30 DIAGNOSIS — O09299 Supervision of pregnancy with other poor reproductive or obstetric history, unspecified trimester: Secondary | ICD-10-CM

## 2017-01-30 NOTE — Progress Notes (Signed)
US for growth scheduled on 6/7.

## 2017-01-31 ENCOUNTER — Encounter (HOSPITAL_COMMUNITY): Payer: Self-pay

## 2017-02-01 ENCOUNTER — Other Ambulatory Visit (HOSPITAL_COMMUNITY): Payer: Self-pay | Admitting: *Deleted

## 2017-02-01 ENCOUNTER — Ambulatory Visit (INDEPENDENT_AMBULATORY_CARE_PROVIDER_SITE_OTHER): Payer: Self-pay | Admitting: Obstetrics and Gynecology

## 2017-02-01 ENCOUNTER — Ambulatory Visit (HOSPITAL_COMMUNITY)
Admission: RE | Admit: 2017-02-01 | Discharge: 2017-02-01 | Disposition: A | Discharge status: Home / Self Care | Payer: Self-pay | Source: Ambulatory Visit | Attending: Obstetrics and Gynecology | Admitting: Obstetrics and Gynecology

## 2017-02-01 ENCOUNTER — Encounter (HOSPITAL_COMMUNITY): Payer: Self-pay

## 2017-02-01 ENCOUNTER — Other Ambulatory Visit (HOSPITAL_COMMUNITY): Payer: Self-pay | Admitting: Maternal and Fetal Medicine

## 2017-02-01 VITALS — BP 107/59 | HR 78 | Wt 162.7 lb

## 2017-02-01 DIAGNOSIS — O0993 Supervision of high risk pregnancy, unspecified, third trimester: Secondary | ICD-10-CM

## 2017-02-01 DIAGNOSIS — O09299 Supervision of pregnancy with other poor reproductive or obstetric history, unspecified trimester: Secondary | ICD-10-CM

## 2017-02-01 DIAGNOSIS — Z603 Acculturation difficulty: Secondary | ICD-10-CM

## 2017-02-01 DIAGNOSIS — O09293 Supervision of pregnancy with other poor reproductive or obstetric history, third trimester: Secondary | ICD-10-CM | POA: Insufficient documentation

## 2017-02-01 DIAGNOSIS — Z3A32 32 weeks gestation of pregnancy: Secondary | ICD-10-CM

## 2017-02-01 DIAGNOSIS — Z789 Other specified health status: Secondary | ICD-10-CM

## 2017-02-01 NOTE — Progress Notes (Signed)
Prenatal Visit Note Date: 02/01/2017 [facility]  Subjective:  Nicole Little is a 36 y.o. [phone] at 6358w2d being seen today for ongoing prenatal care.  She is currently monitored for the following issues for this high-risk pregnancy and has Supervision of high-risk pregnancy; History of intrauterine fetal death, currently pregnant; and Language barrier on her problem list.  Patient reports no complaints.   Contractions: Not present. Vag. Bleeding: None.  Movement: Present. Denies leaking of fluid.   The following portions of the patient's history were reviewed and updated as appropriate: allergies, current medications, past family history, past medical history, past social history, past surgical history and problem list. Problem list updated.  Objective:   Vitals:   02/01/17 1405  BP: (!) 107/59  Pulse: 78  Weight: 162 lb 11.2 oz (73.8 kg)    Fetal Status: Fetal Heart Rate (bpm): NST   Movement: Present     General:  Alert, oriented and cooperative. Patient is in no acute distress.  Skin: Skin is warm and dry. No rash noted.   Cardiovascular: Normal heart rate noted  Respiratory: Normal respiratory effort, no problems with respiration noted  Abdomen: Soft, gravid, appropriate for gestational age. Pain/Pressure: Present     Pelvic:  Cervical exam deferred        Extremities: Normal range of motion.  Edema: Trace  Mental Status: Normal mood and affect. Normal behavior. Normal judgment and thought content.   Urinalysis:      Assessment and Plan:  Pregnancy: [id number] at 6058w2d  1. History of intrauterine fetal death, currently pregnant rNST today (135 baseline, +accels, no decels, mod variability, +irritability on toco x 6457m). F/u growth u/s today. Continue 2x/wk testing - Fetal nonstress test  2. Supervision of high risk pregnancy in third trimester Routine care  3. Language barrier Interpreter used.   Preterm labor symptoms and general  obstetric precautions including but not limited to vaginal bleeding, contractions, leaking of fluid and fetal movement were reviewed in detail with the patient. Please refer to After Visit Summary for other counseling recommendations.  Return in about 5 days (around 02/06/2017) for nst only visit. continue with 2x/wk test. rob in 2wks. [place].   Shirl Ludington, MD

## 2017-02-01 NOTE — Progress Notes (Signed)
US for growth today 

## 2017-02-02 ENCOUNTER — Other Ambulatory Visit: Payer: Self-pay | Admitting: Family Medicine

## 2017-02-06 ENCOUNTER — Other Ambulatory Visit: Payer: Self-pay | Admitting: Advanced Practice Midwife

## 2017-02-06 ENCOUNTER — Ambulatory Visit (INDEPENDENT_AMBULATORY_CARE_PROVIDER_SITE_OTHER): Payer: Self-pay | Admitting: Medical

## 2017-02-06 VITALS — BP 111/64 | HR 83

## 2017-02-06 DIAGNOSIS — O09299 Supervision of pregnancy with other poor reproductive or obstetric history, unspecified trimester: Secondary | ICD-10-CM

## 2017-02-06 DIAGNOSIS — O09293 Supervision of pregnancy with other poor reproductive or obstetric history, third trimester: Secondary | ICD-10-CM

## 2017-02-06 NOTE — Progress Notes (Signed)
NST reviewed.  Fetal Monitoring: Baseline: 125 bpm Variability: moderate Accelerations: 15 x 15 Decelerations: none Contractions: few

## 2017-02-09 ENCOUNTER — Ambulatory Visit: Payer: Self-pay

## 2017-02-09 ENCOUNTER — Ambulatory Visit (INDEPENDENT_AMBULATORY_CARE_PROVIDER_SITE_OTHER): Payer: Self-pay | Admitting: Obstetrics & Gynecology

## 2017-02-09 VITALS — BP 108/60 | HR 78

## 2017-02-09 DIAGNOSIS — O09299 Supervision of pregnancy with other poor reproductive or obstetric history, unspecified trimester: Secondary | ICD-10-CM

## 2017-02-09 DIAGNOSIS — O09293 Supervision of pregnancy with other poor reproductive or obstetric history, third trimester: Secondary | ICD-10-CM

## 2017-02-09 LAB — FETAL NONSTRESS TEST

## 2017-02-13 ENCOUNTER — Ambulatory Visit (INDEPENDENT_AMBULATORY_CARE_PROVIDER_SITE_OTHER): Payer: Self-pay | Admitting: *Deleted

## 2017-02-13 DIAGNOSIS — O09293 Supervision of pregnancy with other poor reproductive or obstetric history, third trimester: Secondary | ICD-10-CM

## 2017-02-13 DIAGNOSIS — O09299 Supervision of pregnancy with other poor reproductive or obstetric history, unspecified trimester: Secondary | ICD-10-CM

## 2017-02-13 NOTE — Progress Notes (Signed)
NST reactive.

## 2017-02-15 ENCOUNTER — Ambulatory Visit: Payer: Self-pay

## 2017-02-15 ENCOUNTER — Ambulatory Visit (INDEPENDENT_AMBULATORY_CARE_PROVIDER_SITE_OTHER): Payer: Self-pay | Admitting: Advanced Practice Midwife

## 2017-02-15 VITALS — BP 113/69 | HR 99 | Wt 164.5 lb

## 2017-02-15 DIAGNOSIS — O09293 Supervision of pregnancy with other poor reproductive or obstetric history, third trimester: Secondary | ICD-10-CM

## 2017-02-15 DIAGNOSIS — O09299 Supervision of pregnancy with other poor reproductive or obstetric history, unspecified trimester: Secondary | ICD-10-CM

## 2017-02-15 DIAGNOSIS — O0993 Supervision of high risk pregnancy, unspecified, third trimester: Secondary | ICD-10-CM

## 2017-02-15 NOTE — Progress Notes (Signed)
   PRENATAL VISIT NOTE  Subjective:  Nicole Little is a 36 y.o. [phone] at 1878w2d being seen today for ongoing prenatal care.  She is currently monitored for the following issues for this high-risk pregnancy and has Supervision of high-risk pregnancy; History of intrauterine fetal death, currently pregnant; and Language barrier on her problem list.  Patient reports occasional contractions.  Contractions: Irregular. Vag. Bleeding: None.  Movement: Present. Denies leaking of fluid.   The following portions of the patient's history were reviewed and updated as appropriate: allergies, current medications, past family history, past medical history, past social history, past surgical history and problem list. Problem list updated.  Objective:   Vitals:   02/15/17 1051  BP: 113/69  Pulse: 99  Weight: 164 lb 8 oz (74.6 kg)    Fetal Status: Fetal Heart Rate (bpm): NST Fundal Height: 35 cm Movement: Present  Presentation: Vertex NST reactive AFI 9.5 General:  Alert, oriented and cooperative. Patient is in no acute distress.  Skin: Skin is warm and dry. No rash noted.   Cardiovascular: Normal heart rate noted  Respiratory: Normal respiratory effort, no problems with respiration noted  Abdomen: Soft, gravid, appropriate for gestational age. Pain/Pressure: Present     Pelvic:  Cervical exam deferred        Extremities: Normal range of motion.  Edema: None  Mental Status: Normal mood and affect. Normal behavior. Normal judgment and thought content.   Assessment and Plan:  Pregnancy: [id number] at 1578w2d  1. History of intrauterine fetal death, currently pregnant - Plan IOL at 39 weeks - Fetal nonstress test - US OB Limited  2. Supervision of high risk pregnancy in third trimester   Preterm labor symptoms and general obstetric precautions including but not limited to vaginal bleeding, contractions, leaking of fluid and fetal movement were reviewed in detail with the patient. Please  refer to After Visit Summary for other counseling recommendations.  Return in about 2 weeks (around 03/01/2017) for Ob fu and NST - has US @ 1430; other appts as scheduled.   Dorathy KinsmanVirginia Rye Dorado, CNM

## 2017-02-15 NOTE — Patient Instructions (Signed)
Contracciones de Designer, multimedia (Braxton Hicks Contractions) Durante el [place], pueden presentarse contracciones uterinas que no siempre indican que est en [place]. QU SON LAS CONTRACCIONES DE BRAXTON HICKS? Las [employer] se presentan antes del [place] de [place] se conocen como contracciones de [place] o falso trabajo de [place]. Hacia el final del embarazo (32 a 34semanas), estas contracciones pueden aparecen con ms frecuencia y volverse ms intensas. No corresponden al Aleen Campi de parto verdadero porque estas contracciones no producen el agrandamiento (la dilatacin) y el afinamiento del cuello del tero. Algunas veces, es difcil distinguirlas del trabajo de parto verdadero porque en algunos casos pueden ser [employer], y las personas tienen diferentes niveles de tolerancia al [employer]. No debe sentirse avergonzada si concurre al hospital con falso trabajo de [place]. En ocasiones, la nica forma de saber si el trabajo de parto es verdadero es que el mdico determine si hay cambios en el cuello del tero. Si no hay problemas prenatales u otras complicaciones de salud asociadas con el embarazo, no habr inconvenientes si la envan a su casa con falso trabajo de parto y espera que comience el verdadero. CMO DIFERENCIAR EL TRABAJO DE PARTO FALSO DEL VERDADERO Falso trabajo de parto  Las contracciones del falso trabajo de parto duran menos y no son tan intensas como las verdaderas.  Generalmente son irregulares.  A menudo, se sienten en la parte delantera de la parte baja del abdomen y en la ingle,  y pueden desaparecer cuando camina o cambia de posicin mientras est acostada.  Las contracciones se vuelven ms dbiles y su duracin es Adult nurse a medida que el tiempo transcurre.  Por lo general, no se hacen progresivamente ms intensas, regulares y Herbalist entre s como en el caso del [place] de parto verdadero. Theodis Blaze de parto  Las contracciones del verdadero  trabajo de parto duran de 30 a 70segundos, son muy regulares y suelen volverse ms intensas, y [place] su frecuencia.  No desaparecen cuando camina.  La molestia generalmente se siente en la parte superior del tero y se extiende hacia la zona inferior del abdomen y [employer] cintura.  El mdico podr examinarla para determinar si el trabajo de parto es verdadero. El examen mostrar si el cuello del tero se est dilatando y [place]. LO QUE DEBE RECORDAR  Contine haciendo los ejercicios habituales y siga otras indicaciones que el mdico le d.  Tome todos los medicamentos como le indic el mdico.  Oceanographer a las visitas prenatales regulares.  Coma y beba con moderacin si cree que est en trabajo de parto.  Si las contracciones de [employer] provocan incomodidad: ? Cambie de posicin: si est acostada o descansando, camine; si est caminando, descanse. ? Sintese y descanse en una baera con agua tibia. ? Beba 2 o 3vasos de agua. La deshidratacin puede provocar contracciones. ? Respire lenta y profundamente varias veces por hora.  CUNDO DEBO BUSCAR ASISTENCIA MDICA INMEDIATA? Solicite atencin mdica de inmediato si:  Las contracciones se intensifican, se hacen ms regulares y Arboriculturist s.  Tiene una prdida de lquido por la vagina.  Tiene fiebre.  Elimina mucosidad manchada con [place].  Tiene una hemorragia vaginal abundante.  Tiene dolor abdominal permanente.  Tiene un dolor en la zona lumbar que nunca tuvo antes.  Siente que la cabeza del beb empuja hacia abajo y ejerce presin en la zona plvica.  El beb no se mueve Dentist. Esta informacin no tiene Theme park manager el consejo  del mdico. Asegrese de hacerle al mdico cualquier pregunta que tenga. Document Released: 05/24/2005 Document Revised: 12/06/2015 Document Reviewed: 05/26/2013 Elsevier Interactive Patient Education  2017 Elsevier Inc.   CologneLigadura de trompas  posparto (Postpartum Tubal Ligation) La ligadura de trompas posparto (LTPP) es un procedimiento para cerrar las trompas de [place]. Esto se realiza con el fin de Location managerevitar el embarazo. Cuando las trompas de NordstromFalopio se Music therapistcierran, los vulos que United States Steel Corporationliberan los ovarios no pueden ingresar al tero, y los espermatozoides no Tree surgeonpueden llegar a los vulos. La LTPP se realiza de inmediato despus del nacimiento, o 1o 2das despus del nacimiento, antes de que el tero regrese a su posicin normal. La LTPP a veces se conoce como "atadura de trompas". No debe realizarse este procedimiento si quiere quedar embarazada alguna vez o si no est segura de si desea tener ms hijos. INFORME A SU MDICO:  Cualquier alergia que tenga.  Todos los Walt Disneymedicamentos que utiliza, incluidos vitaminas, hierbas, gotas oftlmicas, cremas y [address] Stmedicamentos de [address] libre.  Problemas previos que usted o los Graybar Electricmiembros de su familia hayan tenido con el uso de anestsicos.  Enfermedades de la sangre que tenga.  Si tiene cirugas previas.  Si tiene Owens-Illinoisalguna enfermedad.  Cualquier embarazo anterior. RIESGOS Y COMPLICACIONES En general, se trata de un procedimiento seguro. Sin embargo, pueden ocurrir complicaciones, por ejemplo:  Infeccin.  Hemorragia.  Lesin en los rganos circundantes.  Efectos secundarios de los anestsicos.  Fallas en el procedimiento. Este procedimiento puede aumentar el riesgo de una clase de Psychiatristembarazo en el que el vulo fertilizado se adhiere a la parte externa del tero (embarazo ectpico). ANTES DEL PROCEDIMIENTO  Consulte al mdico acerca de lo siguiente: ? El grado de dolor que probablemente sienta. ? Los medicamentos que recibir para Chief Technology Officerel dolor, especialmente si Therapist, musicplanifica amamantar.  Siga las indicaciones del mdico respecto de las restricciones para las comidas y las bebidas. PROCEDIMIENTO Si tuvo un parto vaginal:  Pueden administrarle uno o ms de los siguientes medicamentos: ? Un medicamento para  ayudarla a relajarse (sedante). ? Un medicamento para adormecer la zona (anestesia local). ? Un medicamento que la har dormir (anestesia general). ? Un medicamento que se inyecta en una zona del cuerpo para adormecer toda la regin que se encuentra por debajo del lugar de la inyeccin (anestesia regional).  Si le administran anestesia general, le introducirn un tubo en la garganta para ayudarla a respirar.  Le introducirn una va intravenosa (IV) en una de las venas para administrarle lquidos y medicamentos durante el procedimiento.  Es posible que le vacen la vejiga con una pequea sonda (catter).  Le harn una incisin justo por debajo del ombligo.  A travs de la incisin, las trompas de [place] se Turkmenistanubicarn y se subirn.  Las trompas se atarn o Adult nursequemarn (cauterizarn), o se obstruirn con un clip, un anillo o una abrazadera. Es posible que se extraiga una parte pequea en el centro de cada trompa de [place].  La incisin se cerrar con puntos (suturas).  Se colocar una venda (vendaje) sobre la incisin. Si tuvo un parto por cesrea:  La ligadura de trompas se har a travs de la misma incisin del parto por cesrea.  La incisin se cerrar con suturas.  Se colocar una venda sobre la incisin. Este procedimiento puede variar segn el mdico y el hospital. DESPUS DEL PROCEDIMIENTO  Conley RollsLe controlarn con frecuencia la presin arterial, la frecuencia cardaca, la frecuencia respiratoria y Air cabin crewel nivel de oxgeno en la sangre hasta que haya desaparecido  el efecto de los medicamentos administrados.  Le darn medicamentos para el dolor si los necesita.  No conduzca durante 24horas si le administraron un sedante. Esta informacin no tiene Theme park manager el consejo del mdico. Asegrese de hacerle al mdico cualquier pregunta que tenga. Document Released: 05/24/2005 Document Revised: 12/06/2015 Document Reviewed: 07/25/2015 Elsevier Interactive Patient Education  [employer].

## 2017-02-15 NOTE — Progress Notes (Signed)
Pt informed that the ultrasound is considered a limited OB ultrasound and is not intended to be a complete ultrasound exam.  Patient also informed that the ultrasound is not being completed with the intent of assessing for fetal or placental anomalies or any pelvic abnormalities.  Explained that the purpose of today's ultrasound is to assess for presentation and amniotic fluid volume.  Patient acknowledges the purpose of the exam and the limitations of the study.   US for growth scheduled on 7/5

## 2017-02-16 ENCOUNTER — Other Ambulatory Visit: Payer: Self-pay | Admitting: Obstetrics & Gynecology

## 2017-02-20 ENCOUNTER — Ambulatory Visit (INDEPENDENT_AMBULATORY_CARE_PROVIDER_SITE_OTHER): Payer: Self-pay | Admitting: Obstetrics & Gynecology

## 2017-02-20 VITALS — BP 102/57 | HR 78

## 2017-02-20 DIAGNOSIS — O0993 Supervision of high risk pregnancy, unspecified, third trimester: Secondary | ICD-10-CM

## 2017-02-20 DIAGNOSIS — O09293 Supervision of pregnancy with other poor reproductive or obstetric history, third trimester: Secondary | ICD-10-CM

## 2017-02-20 DIAGNOSIS — O09299 Supervision of pregnancy with other poor reproductive or obstetric history, unspecified trimester: Secondary | ICD-10-CM

## 2017-02-23 ENCOUNTER — Ambulatory Visit (INDEPENDENT_AMBULATORY_CARE_PROVIDER_SITE_OTHER): Payer: Self-pay | Admitting: Obstetrics & Gynecology

## 2017-02-23 ENCOUNTER — Ambulatory Visit (HOSPITAL_COMMUNITY)
Admission: RE | Admit: 2017-02-23 | Discharge: 2017-02-23 | Disposition: A | Discharge status: Home / Self Care | Payer: Self-pay | Source: Ambulatory Visit | Attending: Family Medicine | Admitting: Family Medicine

## 2017-02-23 VITALS — BP 117/54 | HR 78 | Wt 167.0 lb

## 2017-02-23 DIAGNOSIS — O0993 Supervision of high risk pregnancy, unspecified, third trimester: Secondary | ICD-10-CM

## 2017-02-23 DIAGNOSIS — O09299 Supervision of pregnancy with other poor reproductive or obstetric history, unspecified trimester: Secondary | ICD-10-CM | POA: Insufficient documentation

## 2017-02-23 DIAGNOSIS — Z3A35 35 weeks gestation of pregnancy: Secondary | ICD-10-CM | POA: Insufficient documentation

## 2017-02-23 DIAGNOSIS — O09293 Supervision of pregnancy with other poor reproductive or obstetric history, third trimester: Secondary | ICD-10-CM

## 2017-02-23 NOTE — Progress Notes (Signed)
   PRENATAL VISIT NOTE  Subjective:  Nicole Little is a 36 y.o. [phone] at 6011w3d being seen today for ongoing prenatal care.  She is currently monitored for the following issues for this high-risk pregnancy and has Supervision of high-risk pregnancy; History of intrauterine fetal death, currently pregnant; and Language barrier on her problem list.  Patient reports no complaints.  Contractions: Irregular. Vag. Bleeding: None.  Movement: Present. Denies leaking of fluid.   The following portions of the patient's history were reviewed and updated as appropriate: allergies, current medications, past family history, past medical history, past social history, past surgical history and problem list. Problem list updated.  Objective:   Vitals:   02/23/17 0914  BP: (!) 117/54  Pulse: 78  Weight: 167 lb (75.8 kg)    Fetal Status: Fetal Heart Rate (bpm): NST Fundal Height: 35 cm Movement: Present     General:  Alert, oriented and cooperative. Patient is in no acute distress.  Skin: Skin is warm and dry. No rash noted.   Cardiovascular: Normal heart rate noted  Respiratory: Normal respiratory effort, no problems with respiration noted  Abdomen: Soft, gravid, appropriate for gestational age. Pain/Pressure: Present     Pelvic:  Cervical exam deferred        Extremities: Normal range of motion.  Edema: None  Mental Status: Normal mood and affect. Normal behavior. Normal judgment and thought content.   Assessment and Plan:  Pregnancy: [id number] at 5711w3d  1. History of intrauterine fetal death, currently pregnant NST performed today was reviewed and was found to be reactive.  Continue recommended antenatal testing and prenatal care. Growth scan next week  2. Supervision of high risk pregnancy in third trimester Preterm labor symptoms and general obstetric precautions including but not limited to vaginal bleeding, contractions, leaking of fluid and fetal movement were reviewed in detail  with the patient. Please refer to After Visit Summary for other counseling recommendations.  Return in about 1 week (around 03/02/2017) for OB Visit, Pelvic cultures.   Jaynie CollinsUgonna Jazon Jipson, MD

## 2017-02-23 NOTE — Patient Instructions (Signed)
Regrese a la clinica cuando tenga su cita. Si tiene problemas o preguntas, llama a la clinica o vaya a la sal[facility] al [facility].    

## 2017-03-01 ENCOUNTER — Encounter (HOSPITAL_COMMUNITY): Payer: Self-pay

## 2017-03-01 ENCOUNTER — Other Ambulatory Visit: Payer: Self-pay | Admitting: Obstetrics and Gynecology

## 2017-03-01 ENCOUNTER — Ambulatory Visit (HOSPITAL_COMMUNITY)
Admission: RE | Admit: 2017-03-01 | Discharge: 2017-03-01 | Disposition: A | Discharge status: Home / Self Care | Payer: Self-pay | Source: Ambulatory Visit | Attending: Obstetrics and Gynecology | Admitting: Obstetrics and Gynecology

## 2017-03-01 ENCOUNTER — Ambulatory Visit (INDEPENDENT_AMBULATORY_CARE_PROVIDER_SITE_OTHER): Payer: Self-pay | Admitting: Obstetrics and Gynecology

## 2017-03-01 VITALS — BP 111/56 | HR 87 | Wt 167.9 lb

## 2017-03-01 DIAGNOSIS — O09299 Supervision of pregnancy with other poor reproductive or obstetric history, unspecified trimester: Secondary | ICD-10-CM

## 2017-03-01 DIAGNOSIS — Z3A36 36 weeks gestation of pregnancy: Secondary | ICD-10-CM | POA: Insufficient documentation

## 2017-03-01 DIAGNOSIS — Z113 Encounter for screening for infections with a predominantly sexual mode of transmission: Secondary | ICD-10-CM

## 2017-03-01 DIAGNOSIS — O09293 Supervision of pregnancy with other poor reproductive or obstetric history, third trimester: Secondary | ICD-10-CM

## 2017-03-01 DIAGNOSIS — O0993 Supervision of high risk pregnancy, unspecified, third trimester: Secondary | ICD-10-CM

## 2017-03-01 NOTE — Progress Notes (Signed)
US for growth today, BPP added 

## 2017-03-01 NOTE — Progress Notes (Signed)
Subjective:  Nicole Little is a 36 y.o. [phone] at 3211w2d being seen today for ongoing prenatal care.  She is currently monitored for the following issues for this high-risk pregnancy and has Supervision of high-risk pregnancy; History of intrauterine fetal death, currently pregnant; and Language barrier on her problem list.  Patient reports occasional contractions.  Contractions: Irregular. Vag. Bleeding: None.  Movement: Present. Denies leaking of fluid.   The following portions of the patient's history were reviewed and updated as appropriate: allergies, current medications, past family history, past medical history, past social history, past surgical history and problem list. Problem list updated.  Objective:   Vitals:   03/01/17 1251  BP: (!) 111/56  Pulse: 87  Weight: 76.2 kg (167 lb 14.4 oz)    Fetal Status: Fetal Heart Rate (bpm): NST   Movement: Present     General:  Alert, oriented and cooperative. Patient is in no acute distress.  Skin: Skin is warm and dry. No rash noted.   Cardiovascular: Normal heart rate noted  Respiratory: Normal respiratory effort, no problems with respiration noted  Abdomen: Soft, gravid, appropriate for gestational age. Pain/Pressure: Present     Pelvic:  Cervical exam performed        Extremities: Normal range of motion.  Edema: None  Mental Status: Normal mood and affect. Normal behavior. Normal judgment and thought content.   Urinalysis:      Assessment and Plan:  Pregnancy: [id number] at 5811w2d  1. History of intrauterine fetal death, currently pregnant Reactive NST today Continue with antenatal testing - Fetal nonstress test - US MFM FETAL BPP WO NON STRESS; Future  2. Supervision of high risk pregnancy in third trimester Labor precautions - Strep Gp B Culture+Rflx - GC/Chlamydia probe amp ([place]  Preterm labor symptoms and general obstetric precautions including but not limited to vaginal bleeding,  contractions, leaking of fluid and fetal movement were reviewed in detail with the patient. Please refer to After Visit Summary for other counseling recommendations.  Return in about 1 week (around 03/08/2017) for Ob fu and NST/BPP.   Hermina StaggersErvin, Madinah Quarry L, MD

## 2017-03-01 NOTE — Patient Instructions (Signed)
Parto vaginal (Vaginal Delivery) Durante el parto, el mdico la ayudar a dar a luz a su beb. En elparto vaginal, deber pujar para que el beb salga por la vagina. Sin embargo, antes de que pueda sacar al beb, es necesario que ocurran ciertas cosas. La abertura del tero (cuello del tero) tiene que ablandarse, hacerse ms delgado y abrirse (dilatar) hasta que llegue a 10 cm. Adems, el beb tiene que bajar desde el tero a la vagina. SIGNOS DE TRABAJO DE PARTO  El mdico tendr primero que asegurarse de que usted est en trabajo de parto. Algunos signos son:   Eliminar lo que se llama tapn mucoso antes del inicio del trabajo de parto. Este es una pequea cantidad de mucosidad teida con sangre.  Tener contracciones uterinas regulares y dolorosas.   El tiempo entre las contracciones debe acortarse  Las molestias y el dolor se harn ms intensos gradualmente.  El dolor de las contracciones empeora al caminar y no se alivia con el reposo.   El cuello del tero se hace mas delgado (se borra) y se dilata. ANTES DEL PARTO Una vez que se inicie el trabajo de parto y sea admitida en el hospital o sanatorio, el mdico podr hacer lo siguiente:   Realizar un examen fsico.  Controlar si hay complicaciones relacionadas con el trabajo de parto.  Verificar su presin arterial, temperatura y pulso y la frecuencia cardaca (signos vitales).   Determinar si se ha roto el saco amnitico y cundo ha ocurrido.  Realizar un examen vaginal (utilizando un guante estril y un lubricante) para determinar: ? La posicin (presentacin) del beb. El beb se presenta con la cabeza primero (vertex) en el canal de parto (vagina), o estn los pies o las nalgas primero (de nalgas)? ? El nivel (estacin) de la cabeza del beb dentro del canal de parto. ? El borramiento y la dilatacin del cuello uterino  El monitor fetal electrnico generalmente se coloca sobre el abdomen al llegar. Se utiliza para  controlar las contracciones y la frecuencia cardaca del beb. ? Cuando el monitor est en el abdomen (monitor fetal externo), slo toma la frecuencia y la duracin de las contracciones. No informa acerca de la intensidad de las contracciones. ? Si el mdico necesita saber exactamente la intensidad de las contracciones o cul es la frecuencia cardaca del beb, colocar un monitor interno en la vagina y el tero. El mdico comentar los riesgos y los beneficios de usar un monitor interno y le pedir autorizacin antes de colocar el dispositivo. ? El monitoreo fetal continuo ser necesario si le han aplicado una epidural, si le administran ciertos medicamentos (como oxitocina) y si tiene complicaciones del embarazo o del trabajo de parto.  Podrn colocarle una va intravenosa en una vena del brazo para suministrarle lquidos y medicamentos, si es necesario. TRES ETAPAS DEL TRABAJO DE PARTO Y EL PARTO El trabajo de parto y el parto normales se dividen en tres etapas. Primera etapa Esta etapa comienza cuando comienzan las contracciones regulares y el cuello comienza a borrarse y dilatarse. Finaliza cuando el cuello est completamente abierto (completamente dilatado). La primera etapa es la etapa ms larga del trabajo de parto y puede durar desde 3 horas a 15 horas.  Algunos mtodos estn disponibles para ayudar con el dolor del parto. Usted y su mdico decidirn qu opcin es la mejor para usted. Las opciones incluyen:   Medicamentos narcticos. Estos son medicamentos fuertes que usted puede recibir a travs de una va intravenosa o   como inyeccin en el msculo. Estos medicamentos alivian el dolor pero no hacen que desaparezca completamente.  Epidural. Se administra un medicamento a travs de un tubo delgado que se inserta en la espalda. El medicamento adormece la parte inferior del cuerpo y evita el dolor en esa zona.  Bloqueo paracervical Es una inyeccin de un anestsico en cada lado del cuello  uterino.  Usted podr pedir un parto natural, que implica que no se usen analgsicos ni epidural durante el parto y el trabajo de parto. En cambio, podr tener otro tipo de ayuda como ejercicios respiratorios para hacer frente al dolor. Segunda etapa La segunda etapa del trabajo de parto comienza cuando el cuello se ha dilatado completamente a 10 cm. Contina hasta que usted puja al beb hacia abajo, por el canal de parto, y el beb nace. Esta etapa puede durar slo algunos minutos o algunas horas.  La posicin del la cabeza del beb a medida que pasa por el canal de parto, es informada como un nmero, llamado estacin. Si la cabeza del beb no ha iniciado su descenso, la estacin se describe como que est en menos 3 (-3). Cuando la cabeza del beb est en la estacin cero, est en el medio del canal de parto y se encaja en la pelvis. La estacin en la que se encuentra el beb indica el progreso de la segunda etapa del trabajo de parto.  Cuando el beb nace, el mdico lo sostendr con la cabeza hacia abajo para evitar que el lquido amnitico, el moco y la sangre entren en los pulmones del beb. La boca y la nariz del beb podrn ser succionadas con un pequeo bulbo para retirar todo lquido adicional.  El mdico podr colocar al beb sobre su estmago. Es importante evitar que el beb tome fro. Para hacerlo, el mdico secar al beb, lo colocar directamente sobre su piel, (sin mantas entre usted y el beb) y lo cubrir con mantas secas y tibias.  Se corta el cordn umbilical. Tercera etapa Durante la tercera etapa del trabajo de parto, el mdico sacar la placenta (alumbramiento) y se asegurar de que el sangrado est controlado. La salida de la placenta generalmente demora 5 minutos pero puede tardar hasta 30 minutos. Luego de la salida de la placenta, le darn un medicamento por va intravenosa o inyectable para ayudar a contraer el tero y controlar el sangrado. Si planea amamantar al beb,  puede intentar en este momento Luego de la salida de la placenta, el tero debe contraerse y quedar muy firme. Si el tero no queda firme, el mdico lo masajear. Esto es importante debido a que la contraccin del tero ayuda a cortar el sangrado en el sitio en que la placenta estaba unida al tero. Si el tero no se contrae adecuadamente ni permanece firme, podr causar un sangrado abundante. Si hay mucho sangrado, podrn darle medicamentos para contraer el tero y detener el sangrado.  Esta informacin no tiene como fin reemplazar el consejo del mdico. Asegrese de hacerle al mdico cualquier pregunta que tenga. Document Released: 07/27/2008 Document Revised: 09/04/2014 Elsevier Interactive Patient Education  2017 Elsevier Inc.  

## 2017-03-02 LAB — GC/CHLAMYDIA PROBE AMP (~~LOC~~) NOT AT ARMC
Chlamydia: NEGATIVE
NEISSERIA GONORRHEA: NEGATIVE

## 2017-03-04 LAB — OB RESULTS CONSOLE GBS: GBS: NEGATIVE

## 2017-03-04 LAB — STREP GP B CULTURE+RFLX: Strep Gp B Culture+Rflx: NEGATIVE

## 2017-03-08 ENCOUNTER — Ambulatory Visit: Payer: Self-pay

## 2017-03-08 ENCOUNTER — Ambulatory Visit (INDEPENDENT_AMBULATORY_CARE_PROVIDER_SITE_OTHER): Payer: Self-pay | Admitting: Obstetrics and Gynecology

## 2017-03-08 VITALS — BP 112/54 | HR 87 | Wt 171.8 lb

## 2017-03-08 DIAGNOSIS — O09299 Supervision of pregnancy with other poor reproductive or obstetric history, unspecified trimester: Secondary | ICD-10-CM

## 2017-03-08 DIAGNOSIS — O0993 Supervision of high risk pregnancy, unspecified, third trimester: Secondary | ICD-10-CM

## 2017-03-08 DIAGNOSIS — O09293 Supervision of pregnancy with other poor reproductive or obstetric history, third trimester: Secondary | ICD-10-CM

## 2017-03-08 NOTE — Progress Notes (Signed)
Pt informed that the ultrasound is considered a limited OB ultrasound and is not intended to be a complete ultrasound exam.  Patient also informed that the ultrasound is not being completed with the intent of assessing for fetal or placental anomalies or any pelvic abnormalities.  Explained that the purpose of today's ultrasound is to assess for presentation and amniotic fluid volume.  Patient acknowledges the purpose of the exam and the limitations of the study.    US for growth done 7/5.  IOL scheduled 7/24 @ 0730

## 2017-03-08 NOTE — Patient Instructions (Signed)
Parto vaginal (Vaginal Delivery) Durante el parto, el mdico la ayudar a dar a luz a su beb. En elparto vaginal, deber pujar para que el beb salga por la vagina. Sin embargo, antes de que pueda sacar al beb, es necesario que ocurran ciertas cosas. La abertura del tero (cuello del tero) tiene que ablandarse, hacerse ms delgado y abrirse (dilatar) hasta que llegue a 10 cm. Adems, el beb tiene que bajar desde el tero a la vagina. SIGNOS DE TRABAJO DE PARTO  El mdico tendr primero que asegurarse de que usted est en trabajo de parto. Algunos signos son:   Eliminar lo que se llama tapn mucoso antes del inicio del trabajo de parto. Este es una pequea cantidad de mucosidad teida con sangre.  Tener contracciones uterinas regulares y dolorosas.   El tiempo entre las contracciones debe acortarse  Las molestias y el dolor se harn ms intensos gradualmente.  El dolor de las contracciones empeora al caminar y no se alivia con el reposo.   El cuello del tero se hace mas delgado (se borra) y se dilata. ANTES DEL PARTO Una vez que se inicie el trabajo de parto y sea admitida en el hospital o sanatorio, el mdico podr hacer lo siguiente:   Realizar un examen fsico.  Controlar si hay complicaciones relacionadas con el trabajo de parto.  Verificar su presin arterial, temperatura y pulso y la frecuencia cardaca (signos vitales).   Determinar si se ha roto el saco amnitico y cundo ha ocurrido.  Realizar un examen vaginal (utilizando un guante estril y un lubricante) para determinar: ? La posicin (presentacin) del beb. El beb se presenta con la cabeza primero (vertex) en el canal de parto (vagina), o estn los pies o las nalgas primero (de nalgas)? ? El nivel (estacin) de la cabeza del beb dentro del canal de parto. ? El borramiento y la dilatacin del cuello uterino  El monitor fetal electrnico generalmente se coloca sobre el abdomen al llegar. Se utiliza para  controlar las contracciones y la frecuencia cardaca del beb. ? Cuando el monitor est en el abdomen (monitor fetal externo), slo toma la frecuencia y la duracin de las contracciones. No informa acerca de la intensidad de las contracciones. ? Si el mdico necesita saber exactamente la intensidad de las contracciones o cul es la frecuencia cardaca del beb, colocar un monitor interno en la vagina y el tero. El mdico comentar los riesgos y los beneficios de usar un monitor interno y le pedir autorizacin antes de colocar el dispositivo. ? El monitoreo fetal continuo ser necesario si le han aplicado una epidural, si le administran ciertos medicamentos (como oxitocina) y si tiene complicaciones del embarazo o del trabajo de parto.  Podrn colocarle una va intravenosa en una vena del brazo para suministrarle lquidos y medicamentos, si es necesario. TRES ETAPAS DEL TRABAJO DE PARTO Y EL PARTO El trabajo de parto y el parto normales se dividen en tres etapas. Primera etapa Esta etapa comienza cuando comienzan las contracciones regulares y el cuello comienza a borrarse y dilatarse. Finaliza cuando el cuello est completamente abierto (completamente dilatado). La primera etapa es la etapa ms larga del trabajo de parto y puede durar desde 3 horas a 15 horas.  Algunos mtodos estn disponibles para ayudar con el dolor del parto. Usted y su mdico decidirn qu opcin es la mejor para usted. Las opciones incluyen:   Medicamentos narcticos. Estos son medicamentos fuertes que usted puede recibir a travs de una va intravenosa o   como inyeccin en el msculo. Estos medicamentos alivian el dolor pero no hacen que desaparezca completamente.  Epidural. Se administra un medicamento a travs de un tubo delgado que se inserta en la espalda. El medicamento adormece la parte inferior del cuerpo y evita el dolor en esa zona.  Bloqueo paracervical Es una inyeccin de un anestsico en cada lado del cuello  uterino.  Usted podr pedir un parto natural, que implica que no se usen analgsicos ni epidural durante el parto y el trabajo de parto. En cambio, podr tener otro tipo de ayuda como ejercicios respiratorios para hacer frente al dolor. Segunda etapa La segunda etapa del trabajo de parto comienza cuando el cuello se ha dilatado completamente a 10 cm. Contina hasta que usted puja al beb hacia abajo, por el canal de parto, y el beb nace. Esta etapa puede durar slo algunos minutos o algunas horas.  La posicin del la cabeza del beb a medida que pasa por el canal de parto, es informada como un nmero, llamado estacin. Si la cabeza del beb no ha iniciado su descenso, la estacin se describe como que est en menos 3 (-3). Cuando la cabeza del beb est en la estacin cero, est en el medio del canal de parto y se encaja en la pelvis. La estacin en la que se encuentra el beb indica el progreso de la segunda etapa del trabajo de parto.  Cuando el beb nace, el mdico lo sostendr con la cabeza hacia abajo para evitar que el lquido amnitico, el moco y la sangre entren en los pulmones del beb. La boca y la nariz del beb podrn ser succionadas con un pequeo bulbo para retirar todo lquido adicional.  El mdico podr colocar al beb sobre su estmago. Es importante evitar que el beb tome fro. Para hacerlo, el mdico secar al beb, lo colocar directamente sobre su piel, (sin mantas entre usted y el beb) y lo cubrir con mantas secas y tibias.  Se corta el cordn umbilical. Tercera etapa Durante la tercera etapa del trabajo de parto, el mdico sacar la placenta (alumbramiento) y se asegurar de que el sangrado est controlado. La salida de la placenta generalmente demora 5 minutos pero puede tardar hasta 30 minutos. Luego de la salida de la placenta, le darn un medicamento por va intravenosa o inyectable para ayudar a contraer el tero y controlar el sangrado. Si planea amamantar al beb,  puede intentar en este momento Luego de la salida de la placenta, el tero debe contraerse y quedar muy firme. Si el tero no queda firme, el mdico lo masajear. Esto es importante debido a que la contraccin del tero ayuda a cortar el sangrado en el sitio en que la placenta estaba unida al tero. Si el tero no se contrae adecuadamente ni permanece firme, podr causar un sangrado abundante. Si hay mucho sangrado, podrn darle medicamentos para contraer el tero y detener el sangrado.  Esta informacin no tiene como fin reemplazar el consejo del mdico. Asegrese de hacerle al mdico cualquier pregunta que tenga. Document Released: 07/27/2008 Document Revised: 09/04/2014 Elsevier Interactive Patient Education  2017 Elsevier Inc.  

## 2017-03-08 NOTE — Progress Notes (Signed)
Subjective:  Nicole Little is a 36 y.o. [phone] at 4362w2d being seen today for ongoing prenatal care.  She is currently monitored for the following issues for this high-risk pregnancy and has Supervision of high-risk pregnancy; History of intrauterine fetal death, currently pregnant; and Language barrier on her problem list.  Patient reports no complaints.  Contractions: Irregular. Vag. Bleeding: None.  Movement: Present. Denies leaking of fluid.   The following portions of the patient's history were reviewed and updated as appropriate: allergies, current medications, past family history, past medical history, past social history, past surgical history and problem list. Problem list updated.  Objective:   Vitals:   03/08/17 1403  BP: (!) 112/54  Pulse: 87  Weight: 171 lb 12.8 oz (77.9 kg)    Fetal Status: Fetal Heart Rate (bpm): NST   Movement: Present  Presentation: Vertex  General:  Alert, oriented and cooperative. Patient is in no acute distress.  Skin: Skin is warm and dry. No rash noted.   Cardiovascular: Normal heart rate noted  Respiratory: Normal respiratory effort, no problems with respiration noted  Abdomen: Soft, gravid, appropriate for gestational age. Pain/Pressure: Present     Pelvic:  Cervical exam deferred        Extremities: Normal range of motion.     Mental Status: Normal mood and affect. Normal behavior. Normal judgment and thought content.   Urinalysis:      Assessment and Plan:  Pregnancy: [id number] at 4362w2d  1. History of intrauterine fetal death, currently pregnant Continue with antenatal testing - Fetal nonstress test, reactive - US OB Limited IOL scheduled at 39 weeks  2. Supervision of high risk pregnancy in third trimester Labor precautions  Term labor symptoms and general obstetric precautions including but not limited to vaginal bleeding, contractions, leaking of fluid and fetal movement were reviewed in detail with the patient. Please  refer to After Visit Summary for other counseling recommendations.  Return in about 1 week (around 03/15/2017) for NST only; 7/19 or 7/20 Ob fu and NST/AFI, OB visit.   Hermina StaggersErvin, Umer Harig L, MD

## 2017-03-12 ENCOUNTER — Telehealth (HOSPITAL_COMMUNITY): Payer: Self-pay | Admitting: *Deleted

## 2017-03-12 ENCOUNTER — Ambulatory Visit (INDEPENDENT_AMBULATORY_CARE_PROVIDER_SITE_OTHER): Payer: Self-pay | Admitting: Family Medicine

## 2017-03-12 VITALS — BP 112/63 | HR 84

## 2017-03-12 DIAGNOSIS — O09299 Supervision of pregnancy with other poor reproductive or obstetric history, unspecified trimester: Secondary | ICD-10-CM

## 2017-03-12 DIAGNOSIS — O09293 Supervision of pregnancy with other poor reproductive or obstetric history, third trimester: Secondary | ICD-10-CM

## 2017-03-12 NOTE — Telephone Encounter (Signed)
Interpreter number [id number] 

## 2017-03-12 NOTE — Progress Notes (Signed)
NST reactive.

## 2017-03-14 ENCOUNTER — Encounter: Payer: Self-pay | Admitting: General Practice

## 2017-03-14 ENCOUNTER — Inpatient Hospital Stay (HOSPITAL_COMMUNITY)
Admission: AD | Admit: 2017-03-14 | Discharge: 2017-03-16 | DRG: 775 | Disposition: A | Discharge status: Home / Self Care | Payer: Medicaid Other | Source: Ambulatory Visit | Attending: Obstetrics and Gynecology | Admitting: Obstetrics and Gynecology

## 2017-03-14 ENCOUNTER — Encounter (HOSPITAL_COMMUNITY): Payer: Self-pay

## 2017-03-14 DIAGNOSIS — Z3A38 38 weeks gestation of pregnancy: Secondary | ICD-10-CM | POA: Diagnosis not present

## 2017-03-14 DIAGNOSIS — Z880 Allergy status to penicillin: Secondary | ICD-10-CM

## 2017-03-14 DIAGNOSIS — Z3493 Encounter for supervision of normal pregnancy, unspecified, third trimester: Secondary | ICD-10-CM | POA: Diagnosis present

## 2017-03-14 DIAGNOSIS — O4290 Premature rupture of membranes, unspecified as to length of time between rupture and onset of labor, unspecified weeks of gestation: Secondary | ICD-10-CM | POA: Diagnosis present

## 2017-03-14 DIAGNOSIS — O0993 Supervision of high risk pregnancy, unspecified, third trimester: Secondary | ICD-10-CM

## 2017-03-14 DIAGNOSIS — O09299 Supervision of pregnancy with other poor reproductive or obstetric history, unspecified trimester: Secondary | ICD-10-CM

## 2017-03-14 LAB — CBC
HEMATOCRIT: 36.2 % (ref 36.0–46.0)
Hemoglobin: 12 g/dL (ref 12.0–15.0)
MCH: 28.2 pg (ref 26.0–34.0)
MCHC: 33.1 g/dL (ref 30.0–36.0)
MCV: 85.2 fL (ref 78.0–100.0)
PLATELETS: 249 10*3/uL (ref 150–400)
RBC: 4.25 MIL/uL (ref 3.87–5.11)
RDW: 15.1 % (ref 11.5–15.5)
WBC: 9.8 10*3/uL (ref 4.0–10.5)

## 2017-03-14 LAB — TYPE AND SCREEN
ABO/RH(D): B POS
Antibody Screen: NEGATIVE

## 2017-03-14 LAB — POCT FERN TEST: POCT Fern Test: POSITIVE

## 2017-03-14 LAB — ABO/RH: ABO/RH(D): B POS

## 2017-03-14 MED ORDER — ONDANSETRON HCL 4 MG PO TABS: 4.0000 mg | ORAL_TABLET | ORAL | Status: DC | PRN

## 2017-03-14 MED ORDER — DIBUCAINE 1 % RE OINT: 1.0000 "application " | TOPICAL_OINTMENT | RECTAL | Status: DC | PRN

## 2017-03-14 MED ORDER — PRENATAL MULTIVITAMIN CH
1.0000 | ORAL_TABLET | Freq: Every day | ORAL | Status: DC
  Administered 2017-03-15 – 2017-03-16 (×2): 1 via ORAL
  Filled 2017-03-14 (×2): qty 1

## 2017-03-14 MED ORDER — ACETAMINOPHEN 325 MG PO TABS: 650.0000 mg | ORAL_TABLET | ORAL | Status: DC | PRN

## 2017-03-14 MED ORDER — TETANUS-DIPHTH-ACELL PERTUSSIS 5-2.5-18.5 LF-MCG/0.5 IM SUSP: 0.5000 mL | Freq: Once | INTRAMUSCULAR | Status: DC

## 2017-03-14 MED ORDER — OXYCODONE-ACETAMINOPHEN 5-325 MG PO TABS: 2.0000 | ORAL_TABLET | ORAL | Status: DC | PRN

## 2017-03-14 MED ORDER — ACETAMINOPHEN 325 MG PO TABS
650.0000 mg | ORAL_TABLET | ORAL | Status: DC | PRN
  Administered 2017-03-15 (×2): 650 mg via ORAL
  Filled 2017-03-14 (×2): qty 2

## 2017-03-14 MED ORDER — COCONUT OIL OIL: 1.0000 "application " | TOPICAL_OIL | Status: DC | PRN

## 2017-03-14 MED ORDER — WITCH HAZEL-GLYCERIN EX PADS: 1.0000 "application " | MEDICATED_PAD | CUTANEOUS | Status: DC | PRN

## 2017-03-14 MED ORDER — IBUPROFEN 600 MG PO TABS
600.0000 mg | ORAL_TABLET | Freq: Four times a day (QID) | ORAL | Status: DC
  Administered 2017-03-14 – 2017-03-16 (×7): 600 mg via ORAL
  Filled 2017-03-14 (×7): qty 1

## 2017-03-14 MED ORDER — OXYTOCIN 40 UNITS IN LACTATED RINGERS INFUSION - SIMPLE MED
2.5000 [IU]/h | INTRAVENOUS | Status: DC
  Administered 2017-03-14: 2.5 [IU]/h via INTRAVENOUS
  Filled 2017-03-14: qty 1000

## 2017-03-14 MED ORDER — SENNOSIDES-DOCUSATE SODIUM 8.6-50 MG PO TABS
2.0000 | ORAL_TABLET | ORAL | Status: DC
  Administered 2017-03-15 – 2017-03-16 (×2): 2 via ORAL
  Filled 2017-03-14 (×2): qty 2

## 2017-03-14 MED ORDER — FENTANYL CITRATE (PF) 100 MCG/2ML IJ SOLN
100.0000 ug | Freq: Once | INTRAMUSCULAR | Status: AC
  Administered 2017-03-14: 100 ug via INTRAVENOUS
  Filled 2017-03-14: qty 2

## 2017-03-14 MED ORDER — SIMETHICONE 80 MG PO CHEW: 80.0000 mg | CHEWABLE_TABLET | ORAL | Status: DC | PRN

## 2017-03-14 MED ORDER — DIPHENHYDRAMINE HCL 25 MG PO CAPS: 25.0000 mg | ORAL_CAPSULE | Freq: Four times a day (QID) | ORAL | Status: DC | PRN

## 2017-03-14 MED ORDER — OXYCODONE-ACETAMINOPHEN 5-325 MG PO TABS: 1.0000 | ORAL_TABLET | ORAL | Status: DC | PRN

## 2017-03-14 MED ORDER — BENZOCAINE-MENTHOL 20-0.5 % EX AERO: 1.0000 "application " | INHALATION_SPRAY | CUTANEOUS | Status: DC | PRN

## 2017-03-14 MED ORDER — LIDOCAINE HCL (PF) 1 % IJ SOLN
30.0000 mL | INTRAMUSCULAR | Status: DC | PRN
  Filled 2017-03-14: qty 30

## 2017-03-14 MED ORDER — LACTATED RINGERS IV SOLN
INTRAVENOUS | Status: DC
  Administered 2017-03-14: 14:00:00 via INTRAVENOUS

## 2017-03-14 MED ORDER — ONDANSETRON HCL 4 MG/2ML IJ SOLN: 4.0000 mg | INTRAMUSCULAR | Status: DC | PRN

## 2017-03-14 MED ORDER — OXYTOCIN BOLUS FROM INFUSION
500.0000 mL | Freq: Once | INTRAVENOUS | Status: AC
  Administered 2017-03-14: 500 mL via INTRAVENOUS

## 2017-03-14 MED ORDER — ONDANSETRON HCL 4 MG/2ML IJ SOLN: 4.0000 mg | Freq: Four times a day (QID) | INTRAMUSCULAR | Status: DC | PRN

## 2017-03-14 MED ORDER — SOD CITRATE-CITRIC ACID 500-334 MG/5ML PO SOLN: 30.0000 mL | ORAL | Status: DC | PRN

## 2017-03-14 MED ORDER — ZOLPIDEM TARTRATE 5 MG PO TABS
5.0000 mg | ORAL_TABLET | Freq: Every evening | ORAL | Status: DC | PRN
Start: 2017-03-14 — End: 2017-03-16

## 2017-03-14 MED ORDER — LACTATED RINGERS IV SOLN: 500.0000 mL | INTRAVENOUS | Status: DC | PRN

## 2017-03-14 MED ORDER — MEASLES, MUMPS & RUBELLA VAC ~~LOC~~ INJ
0.5000 mL | INJECTION | Freq: Once | SUBCUTANEOUS | Status: DC
  Filled 2017-03-14: qty 0.5

## 2017-03-14 NOTE — Anesthesia Pain Management Evaluation Note (Signed)
  CRNA Pain Management Visit Note  Patient: Nicole Little, 36 y.o., female  "Hello I am a member of the anesthesia team at [facility]. We have an anesthesia team available at all times to provide care throughout the hospital, including epidural management and anesthesia for C-section. I don't know your plan for the delivery whether it a natural birth, water birth, IV sedation, nitrous supplementation, doula or epidural, but we want to meet your pain goals."   1.Was your pain managed to your expectations on prior hospitalizations?   Yes   2.What is your expectation for pain management during this hospitalization?     IV pain meds probably  3.How can we help you reach that goal? Currently against getting an epidural. Explained to the patient she could take IV pain medicine, then change to epidural if desired. Spoke to patient through hospital spanish interpreter  Record the patient's initial score and the patient's pain goal.   Pain: 5  Pain Goal: 10 [facility] wants you to be able to say your pain was always managed very well.  Greer Koeppen 03/14/2017

## 2017-03-14 NOTE — H&P (Signed)
LABOR AND DELIVERY ADMISSION HISTORY AND PHYSICAL NOTE  Nicole Little is a 36 y.o. female [phone] with IUP at [redacted]w[redacted]d by LMP presenting for SROM. Denies headache, blurry vision, chest pain, shortness of breath, RUQ pain.  She reports positive fetal movement. She denies vaginal bleeding.  Prenatal History/Complications: H/o neonatal demise vs IUFD  Past Medical History: Past Medical History:  Diagnosis Date  . Medical history non-contributory     Past Surgical History: No past surgical history on file.  Obstetrical History: OB History    Gravida Para Term Preterm AB Living   4 3 2 1  0 2   SAB TAB Ectopic Multiple Live Births   0 0 0 0 2      Social History: Social History   Social History  . Marital status: Married    Spouse name: N/A  . Number of children: N/A  . Years of education: N/A   Social History Main Topics  . Smoking status: Never Smoker  . Smokeless tobacco: Never Used  . Alcohol use No  . Drug use: No  . Sexual activity: Yes    Birth control/ protection: None   Other Topics Concern  . Not on file   Social History Narrative  . No narrative on file    Family History: Family History  Problem Relation Age of Onset  . Diabetes Mother   . Diabetes Father     Allergies: Allergies  Allergen Reactions  . Penicillins Rash    Prescriptions Prior to Admission  Medication Sig Dispense Refill Last Dose  . ferrous sulfate 325 (65 FE) MG tablet Take 325 mg by mouth daily with breakfast.   Taking  . Prenatal Vit-Fe Fumarate-FA (MULTIVITAMIN-PRENATAL) 27-0.8 MG TABS tablet Take 1 tablet by mouth daily at 12 noon.   Taking  . ranitidine (ZANTAC) 150 MG tablet Take 1 tablet (150 mg total) by mouth 2 (two) times daily. 60 tablet 3 Taking     Review of Systems   All systems reviewed and negative except as stated in HPI  Blood pressure 125/65, pulse 81, temperature 98.2 F (36.8 C), resp. rate 16, last menstrual period 06/20/2016, SpO2 98 %,  unknown if currently breastfeeding. General appearance: alert, cooperative, appears stated age and no distress Lungs: clear to auscultation bilaterally Heart: regular rate and rhythm Abdomen: soft, non-tender; bowel sounds normal Extremities: No calf swelling or tenderness Presentation: vertex, 2/40/-2 Fetal monitoring: 140/mod/+ac/-dc Uterine activity: q35min   Prenatal labs: ABO, Rh: B/POS/-- 10/22/2022 1328) Antibody: NEG 2022/10/22 1328) Rubella: immune RPR: Non Reactive (05/14 0807)  HBsAg: NEGATIVE 2022/10/22 1328)  HIV: NONREACTIVE 2022-10-22 1328)  GBS:   Negative 1 hr Glucola: wnl Genetic screening:  wnl Anatomy US: wnl  Prenatal Transfer Tool  Maternal Diabetes: No Genetic Screening: Normal Maternal Ultrasounds/Referrals: Normal Fetal Ultrasounds or other Referrals:  None Maternal Substance Abuse:  No Significant Maternal Medications:  None Significant Maternal Lab Results: None  Results for orders placed or performed during the hospital encounter of 03/14/17 (from the past 24 hour(s))  POCT fern test   Collection Time: 03/14/17 12:51 PM  Result Value Ref Range   POCT Fern Test Positive = ruptured amniotic membanes     Patient Active Problem List   Diagnosis Date Noted  . Amniotic fluid leaking 03/14/2017  . Language barrier 11/02/2016  . Supervision of high-risk pregnancy 22-Oct-2016  . History of intrauterine fetal death, currently pregnant 22-Oct-2016    Assessment: Nicole Little is a 36 y.o. [phone] at  7450w1d here for SROM  #Labor: Expectant management of labor, augment with pitocin as needed #Pain: nonpharm for now; labor support #FWB: Cat 1 (140/mod/+ac/-dc) #ID:  GBS neg #MOF: breast/bottle #MOC: undecided #Circ:  n/a  Elige RadonJoshua A Christian 03/14/2017, 1:22 PM  OB FELLOW HISTORY AND PHYSICAL ATTESTATION  I confirm that I have verified the information documented in the resident's note with additions and that I have also personally reperformed the  physical exam and all medical decision making activities.  Caryl AdaJazma Badr Piedra, DO OB Fellow 03/14/2017, 2:27 PM

## 2017-03-14 NOTE — Progress Notes (Signed)
Delivery Note At 6:45 PM a viable female was delivered via Vaginal, Spontaneous Delivery (Presentation: OA).  APGAR: 8, 9; weight pending .   Placenta status: Intact.  Cord: 3v with no complications.  Cord pH: Not obtained  Anesthesia: None. Episiotomy: None Lacerations: None Est. Blood Loss (mL): 50  Mom to postpartum.  Baby to Couplet care / Skin to Skin.  Conard NovakJoshua A Emmalia Heyboer 03/14/2017, 7:11 PM

## 2017-03-14 NOTE — MAU Note (Signed)
Pt reports ROM at 1140 am. Pain 1/10, reports mild contractions.

## 2017-03-15 ENCOUNTER — Other Ambulatory Visit: Payer: Self-pay | Admitting: Obstetrics and Gynecology

## 2017-03-15 LAB — RPR: RPR Ser Ql: NONREACTIVE

## 2017-03-15 NOTE — Progress Notes (Signed)
Post Partum Day 1 Subjective: Patient is doing well, complaining of cramping well controlled on ibuprofen. Breastfeeding is going well, patient isup ad lib, voiding, tolerating PO and + flatus  Objective: Blood pressure (!) 101/48, pulse 72, temperature 98.2 F (36.8 C), temperature source Oral, resp. rate 17, height 5\' 2"  (1.575 m), weight 77.6 kg (171 lb), last menstrual period 06/20/2016, SpO2 98 %, unknown if currently breastfeeding.  Physical Exam:  General: alert, cooperative, appears stated age and no distress Lochia: appropriate Uterine Fundus: firm Incision: None DVT Evaluation: No evidence of DVT seen on physical exam.   Recent Labs  03/14/17 1321  HGB 12.0  HCT 36.2    Assessment/Plan: Plan for discharge tomorrow   LOS: 1 day   Conard NovakJoshua A Christian 03/15/2017, 11:41 AM   OB FELLOW POSTPARTUM PROGRESS NOTE ATTESTATION  I have seen and examined this patient and agree with above documentation in the resident's note.   Frederik PearJulie P Mordche Hedglin, MD OB Fellow 12:18 PM

## 2017-03-15 NOTE — Progress Notes (Signed)
UR chart review completed.  

## 2017-03-16 LAB — BIRTH TISSUE RECOVERY COLLECTION (PLACENTA DONATION)

## 2017-03-16 MED ORDER — IBUPROFEN 600 MG PO TABS: 600.0000 mg | ORAL_TABLET | Freq: Four times a day (QID) | ORAL | 0 refills | Status: DC

## 2017-03-16 NOTE — Progress Notes (Signed)
POSTPARTUM PROGRESS NOTE  Post Partum Day 2 Subjective:  Nicole Little is a 36 y.o. [id number] 5774w1d s/p NSVD.  No acute events overnight.  Pt denies problems with ambulating, voiding or po intake.  She denies nausea or vomiting.  Pain is well controlled.  She has had flatus. She has had bowel movement.  Lochia Moderate. She complains of swelling in her feet and wants to know how long this will take to resolve. [facility] she would like to use the Depo shot.   Objective: Blood pressure 114/63, pulse 67, temperature 97.9 F (36.6 C), temperature source Oral, resp. rate 15, height 5\' 2"  (1.575 m), weight 77.6 kg (171 lb), last menstrual period 06/20/2016, SpO2 98 %, unknown if currently breastfeeding.  Physical Exam:  General: alert, cooperative and no distress Chest: CTAB Heart: RRR no m/r/g Abdomen: +BS, soft, nontender,  Uterine Fundus: firm DVT Evaluation: No calf swelling or tenderness Extremities: 1+ edema in the feet bilaterally    Recent Labs  03/14/17 1321  HGB 12.0  HCT 36.2    Assessment/Plan:  ASSESSMENT: Nicole Little is a 36 y.o. [id number] 3674w1d s/p NSVD 2 days ago.   Discharge home   #Swelling of feet: pt advised to continue ambulation and give several days or up to a week for pedal edema to resolve.   #contraception: pt was advised to call and schedule a 4 week follow up down stairs. She was advised that depo could be given then if she has not has sexual intercourse in the interim. She was advised that other wise pregnancy would have to be ruled out.   LOS: 2 days   Sullivan LoneBrannon L Skye Plamondon, Medical Student

## 2017-03-16 NOTE — Discharge Summary (Signed)
OB Discharge Summary Visit conducted with video Spanish interpreter    Patient Name: Nicole SnellenFebe E Rodriguez Little DOB: 10/05/1980 MRN: [id number]  Date of admission: 03/14/2017 Delivering MD: Derry LoryHRISTIAN, JOSHUA A   Date of discharge: 03/16/2017  Admitting diagnosis: 38WKS WATER BROKE Intrauterine pregnancy: 357w1d     Secondary diagnosis:  Principal Problem:   Spontaneous vaginal delivery  Additional problems: hx 36wk IUFD first preg; SROM     Discharge diagnosis: Term Pregnancy Delivered                                                                                                Post partum procedures:none  Augmentation: none  Complications: None  Hospital course:  Onset of Labor With Vaginal Delivery     36 y.o. yo [id number] at 4857w1d was admitted in Latent Labor on 03/14/2017. Patient had an uncomplicated labor course as follows:  Membrane Rupture Time/Date: 11:40 AM ,03/14/2017   Intrapartum Procedures: Episiotomy: None [1]                                         Lacerations:  None [1]  Patient had a delivery of a Viable infant. 03/14/2017  Information for the patient's newborn:  Nicole Little, Girl Lynnea FerrierFebe [[id number]][[id number]]  Delivery Method: Vag-Spont    Pateint had an uncomplicated postpartum course.  She is ambulating, tolerating a regular diet, passing flatus, and urinating well. Patient is discharged home in stable condition on 03/16/17.   Physical exam  Vitals:   03/15/17 1030 03/15/17 1500 03/15/17 1800 03/16/17 0642  BP: (!) 101/48 (!) 102/56 111/60 114/63  Pulse: 72 86 73 67  Resp: 17 16 15    Temp: 98.2 F (36.8 C) 97.9 F (36.6 C) 98.4 F (36.9 C) 97.9 F (36.6 C)  TempSrc: Oral Oral Oral Oral  SpO2:      Weight:      Height:       General: alert and cooperative Lochia: appropriate Uterine Fundus: firm Incision: N/A DVT Evaluation: No evidence of DVT seen on physical exam. Labs: Lab Results  Component Value Date   WBC 9.8 03/14/2017   HGB 12.0 03/14/2017    HCT 36.2 03/14/2017   MCV 85.2 03/14/2017   PLT 249 03/14/2017   CMP Latest Ref Rng & Units 09/05/2016  Glucose 65 - 99 mg/dL 98  BUN 6 - 20 mg/dL 12  Creatinine 1.300.44 - 8.651.00 mg/dL 7.840.50  Sodium 696135 - 295145 mmol/L 134(L)  Potassium 3.5 - 5.1 mmol/L 4.0  Chloride 101 - 111 mmol/L 103    Discharge instruction: per After Visit Summary and "Baby and Me Booklet".  After visit meds:  Allergies as of 03/16/2017      Reactions   Penicillins Rash, Other (See Comments)   Has patient had a PCN reaction causing immediate rash, facial/tongue/throat swelling, SOB or lightheadedness with hypotension: No Has patient had a PCN reaction causing severe rash involving mucus membranes or skin necrosis: No Has patient had a PCN reaction that required hospitalization:  No Has patient had a PCN reaction occurring within the last 10 years: No If all of the above answers are "NO", then may proceed with Cephalosporin use.      Medication List    STOP taking these medications   ranitidine 150 MG tablet Commonly known as:  ZANTAC     TAKE these medications   ferrous sulfate 325 (65 FE) MG tablet Take 325 mg by mouth daily with breakfast.   ibuprofen 600 MG tablet Commonly known as:  ADVIL,MOTRIN Take 1 tablet (600 mg total) by mouth every 6 (six) hours.   multivitamin-prenatal 27-0.8 MG Tabs tablet Take 1 tablet by mouth daily.       Diet: routine diet  Activity: Advance as tolerated. Pelvic rest for 6 weeks.   Outpatient follow up:4 weeks Follow up Appt:Future Appointments Date Time Provider Department Center  04/26/2017 1:20 PM Marylene Land, CNM [facility]   Follow up Visit:No Follow-up on file.  Postpartum contraception: Depo Provera  Newborn Data: Live born female  Birth Weight: 6 lb 12.8 oz (3084 g) APGAR: 8, 9  Baby Feeding: Bottle and Breast Disposition:home with mother   03/16/2017 Cam Hai, CNM  9:10 AM

## 2017-03-16 NOTE — Discharge Instructions (Signed)
Instrucciones para la mamá sobre los cuidados en el hogar °(Home Care Instructions for Mom) °ACTIVIDAD °· Reanude sus actividades regulares de forma gradual. °· Descanse. Tome siestas cuando el bebé duerme. °· No levante objetos que pesen más de 10 libras (4,5 kg) hasta que el médico se lo autorice. °· Evite las actividades que demandan mucho esfuerzo y energía (que son extenuantes) hasta que el médico se lo autorice. Caminar a un ritmo tranquilo a moderado siempre es más seguro. °· Si tuvo un parto por cesárea: °? No pase la aspiradora, suba escaleras o conduzca un vehículo durante 4 o 6 semanas. °? Pídale a alguien que le brinde ayuda con las tareas domésticas hasta que pueda realizarlas por su cuenta. °? Haga ejercicios como se lo haya indicado el médico, si corresponde. ° °HEMORRAGIA VAGINAL °Probablemente continúe sangrando durante 4 o 6 semanas después del parto. Generalmente, la cantidad de sangre disminuye y el color se hace más claro con el transcurso del tiempo. Sin embargo, si usted está demasiado activa, el color de la sangre puede ser rojo brillante. Si necesita cambiarse la compresa higiénica en menos de una hora o tiene coágulos grandes: °· Permanezca acostada. °· Eleve los pies. °· Coloque compresas frías en la zona inferior del abdomen. °· Haga reposo. °· Comuníquese con su médico. °Si está amamantando, podría volver a tener su período entre las 8 semanas después del parto y el momento en que deje de amamantar. Si no está amamantando, volverá a tener su período 6 u 8 semanas después del parto. °CUIDADOS PERINEALES °La zona perineal o perineo, es la parte del cuerpo que se encuentra entre los muslos. Después del parto, esta zona necesita un cuidado especial. Siga las siguientes indicaciones como se lo haya indicado su médico. °· Tome baños de inmersión durante 15 o 20 minutos. °· Utilice apósitos o aerosoles analgésicos y cremas como se lo hayan indicado. °· No utilice tampones ni se haga duchas  vaginales hasta que el sangrado vaginal se haya detenido. °· Cada vez que vaya al baño: °? Use una botella perineal. °? Cámbiese el apósito. °? Use papel tisú en lugar de papel higiénico hasta que se cure la sutura. °· Haga ejercicios de Kegel todos los días. Los ejercicios Kegel ayudan a mantener los músculos que sostienen la vagina, la vejiga y los intestinos. Estos ejercicios se pueden realizar mientras está parada, sentada o acostada. Para hacer los ejercicios de Kegel: °? Tense los músculos del estómago y los que rodean el canal de parto. °? Mantenga esta posición durante unos segundos. °? Relájese. °? Repita hasta hacerlos 5 veces seguidas. °· Para evitar las hemorroides o que estas empeoren: °? Beba suficiente líquido para mantener la orina clara o de color amarillo pálido. °? Evite hacer fuerza al defecar. °? Tome los medicamentos y laxantes de venta libre como se lo haya indicado el médico. °CUIDADO DE LAS MAMAS °· Use un buen sostén. °· Evite tomar analgésicos de venta libre para las molestias de los pechos. °· Aplique hielo en los pechos para aliviar las molestias tanto como sea necesario: °? Ponga el hielo en una bolsa plástica. °? Coloque una toalla entre la piel y la bolsa de hielo. °? Aplique el hielo durante 20, o como se lo haya indicado el médico. ° °NUTRICIÓN °· Mantenga una dieta bien balanceada. °· No intente perder de peso rápidamente reduciendo el consumo de calorías. °· Tome sus vitaminas prenatales hasta el control de postparto o hasta que su médico se lo indique. ° °DEPRESIÓN POSTPARTO °  Puede sentir deseos de llorar sin motivo aparente y verse incapaz de enfrentarse a todos los cambios que implica tener un bebé. Este estado de ánimo se llama depresión postparto. La depresión postparto ocurre porque sus niveles hormonales sufren cambios después del parto. Si usted tiene depresión postparto, busque contención por parte de su pareja, sus amigos y su familia. Si la depresión no desaparece por  sí sola después de algunas semanas, concurra a su médico. °AUTOEXAMEN DE MAMAS °Realícese autoexámenes en el mismo momento cada mes. Si está amamantando, el mejor momento de controlar sus mamas es después de alimentar al bebé, cuando los pechos no están tan llenos. Si está amamantando y su período ya comenzó, controle sus mamas el día 5, 6 o 7 de su período. °Informe a su médico de cualquier protuberancia, bulto o secreción. Si está amamantando, las mamas normalmente tienen bultos. Esto es transitorio y no es un riesgo para la salud. °INTIMIDAD Y SEXUALIDAD °Debe evitar las relaciones sexuales durante al menos 3 o 4 semanas después del parto o hasta que el flujo de color rojo amarronado haya desaparecido completamente. Si no desea quedar embarazada nuevamente, use algún método anticonceptivo. Después del parto, puede quedar embarazada incluso si no ha tenido todavía el período. °SOLICITE ATENCIÓN MÉDICA SI: °· Se siente incapaz de controlar los cambios que implica tener un hijo y esos sentimientos no desaparecen después de algunas semanas. °· Detecta una protuberancia, bulto o secreción en sus mamas. ° °SOLICITE ATENCIÓN MÉDICA DE INMEDIATO SI: °· Debe cambiarse la compresa higiénica en 1 hora o menos. °· Tiene los siguientes síntomas: °? Dolor intenso o calambres en la parte inferior del abdomen. °? Una secreción vaginal con mal olor. °? Fiebre que no se alivia con los medicamentos. °? Una zona de la mama se pone roja y le causa dolor, y además usted tiene fiebre. °? Una pantorrilla enrojecida y con dolor. °? Repentino e intenso dolor en el pecho. °? Falta de aire. °? Micción dolorosa o con sangre. °? Problemas visuales. °· Vómitos durante 12 horas o más. °· Dolor de cabeza intenso. °· Tiene pensamientos serios acerca de lastimarse a usted misma o dañar al niño o a otra persona. ° °Esta información no tiene como fin reemplazar el consejo del médico. Asegúrese de hacerle al médico cualquier pregunta que  tenga. °Document Released: 08/14/2005 Document Revised: 12/06/2015 Document Reviewed: 02/15/2015 °Elsevier Interactive Patient Education © 2017 [employer] Inc. ° °

## 2017-03-16 NOTE — Lactation Note (Addendum)
This note was copied from a baby's chart. Lactation Consultation Note  Patient Name: Nicole Little ZOXWR'UToday's Date: 03/16/2017 Reason for consult: Initial assessment  Baby 38 hours old. Assisted by Spanish Interpreter via [employer], Gena [phone]#[id number]. Mom reports that she nursed 2 older children for 40 days each--supplementing with formula--but had low breast milk supply. Mom states that she thinks she is producing more breast milk this time. Mom able to hand express with colostrum easily flowing bilaterally. Assisted mom to latch baby in cradle position to left breast and demonstrated how to support baby's head to achieve a deep latch. Mom reports that baby has been latching deeply like this and she denies any nipple pain. Baby nursed for 15 minutes with some swallows, and mom's nipple everted well and not misshapen after baby stopped nursing. Baby seemed satisfied and sleeping after nursing.   Discussed supply and demand and enc mom to keep putting baby to breast first with cues, and then supplement with EBM/formula. Mom given manual pump with review and enc to use after baby fed to stimulate breast. Enc mom to use EBM in place of formula as able. Referred mom to Spanish "Mother and Baby Care" book for EBM storage guidelines. Mom had questions about what type of formula. Discussed with mom that she has been giving the baby Similac, but she can discuss with pediatrician.   Mom given [facility] brochure with review, and is aware of OP/BFSG and LC phone line assistance after D/C.   Maternal Data Has patient been taught Hand Expression?: Yes Does the patient have breastfeeding experience prior to this delivery?: Yes  Feeding Feeding Type: Breast Fed Length of feed: 15 min  LATCH Score/Interventions Latch: Grasps breast easily, tongue down, lips flanged, rhythmical sucking. Intervention(s): Adjust position;Assist with latch;Breast compression  Audible Swallowing: A few with  stimulation Intervention(s): Skin to skin;Hand expression  Type of Nipple: Everted at rest and after stimulation  Comfort (Breast/Nipple): Soft / non-tender     Hold (Positioning): Assistance needed to correctly position infant at breast and maintain latch. Intervention(s): Breastfeeding basics reviewed;Support Pillows;Position options;Skin to skin  LATCH Score: 8  Lactation Tools Discussed/Used     Consult Status Consult Status: PRN    Sherlyn HayJennifer D Mirelle Biskup 03/16/2017, 9:43 AM

## 2017-03-20 ENCOUNTER — Inpatient Hospital Stay (HOSPITAL_COMMUNITY): Admission: RE | Admit: 2017-03-20 | Payer: Self-pay | Source: Ambulatory Visit

## 2017-04-26 ENCOUNTER — Ambulatory Visit: Payer: Self-pay | Admitting: Student

## 2017-04-26 ENCOUNTER — Encounter: Payer: Self-pay | Admitting: General Practice

## 2017-05-07 ENCOUNTER — Ambulatory Visit: Payer: Self-pay | Admitting: Medical

## 2017-10-17 ENCOUNTER — Encounter: Payer: Self-pay | Admitting: *Deleted

## 2021-09-22 ENCOUNTER — Emergency Department (HOSPITAL_COMMUNITY)
Admission: EM | Admit: 2021-09-22 | Discharge: 2021-09-22 | Disposition: A | Discharge status: Home / Self Care | Payer: Self-pay | Attending: Emergency Medicine | Admitting: Emergency Medicine

## 2021-09-22 ENCOUNTER — Other Ambulatory Visit: Payer: Self-pay

## 2021-09-22 ENCOUNTER — Encounter (HOSPITAL_COMMUNITY): Payer: Self-pay | Admitting: Emergency Medicine

## 2021-09-22 ENCOUNTER — Emergency Department (HOSPITAL_COMMUNITY): Payer: Self-pay

## 2021-09-22 DIAGNOSIS — N9489 Other specified conditions associated with female genital organs and menstrual cycle: Secondary | ICD-10-CM | POA: Insufficient documentation

## 2021-09-22 DIAGNOSIS — R42 Dizziness and giddiness: Secondary | ICD-10-CM

## 2021-09-22 DIAGNOSIS — G44209 Tension-type headache, unspecified, not intractable: Secondary | ICD-10-CM

## 2021-09-22 LAB — I-STAT BETA HCG BLOOD, ED (MC, WL, AP ONLY): I-stat hCG, quantitative: <5 m[IU]/mL (ref <5)

## 2021-09-22 LAB — COMPREHENSIVE METABOLIC PANEL
ALT: 89 U/L — ABNORMAL HIGH (ref 0–44)
AST: 49 U/L — ABNORMAL HIGH (ref 15–41)
Albumin: 4 g/dL (ref 3.5–5.0)
Alkaline Phosphatase: 96 U/L (ref 38–126)
Anion gap: 8 (ref 5–15)
BUN: 11 mg/dL (ref 6–20)
CO2: 27 mmol/L (ref 22–32)
Calcium: 9.7 mg/dL (ref 8.9–10.3)
Chloride: 104 mmol/L (ref 98–111)
Creatinine, Ser: 0.7 mg/dL (ref 0.44–1.00)
GFR, Estimated: >60 mL/min (ref >60)
Glucose, Bld: 115 mg/dL — ABNORMAL HIGH (ref 70–99)
Potassium: 4.3 mmol/L (ref 3.5–5.1)
Sodium: 139 mmol/L (ref 135–145)
Total Bilirubin: 0.3 mg/dL (ref 0.3–1.2)
Total Protein: 7.7 g/dL (ref 6.5–8.1)

## 2021-09-22 LAB — DIFFERENTIAL
Abs Immature Granulocytes: 0.01 10*3/uL (ref 0.00–0.07)
Basophils Absolute: 0 10*3/uL (ref 0.0–0.1)
Basophils Relative: 0 %
Eosinophils Absolute: 0.1 10*3/uL (ref 0.0–0.5)
Eosinophils Relative: 1 %
Immature Granulocytes: 0 %
Lymphocytes Relative: 26 %
Lymphs Abs: 1.9 10*3/uL (ref 0.7–4.0)
Monocytes Absolute: 0.4 10*3/uL (ref 0.1–1.0)
Monocytes Relative: 6 %
Neutro Abs: 4.9 10*3/uL (ref 1.7–7.7)
Neutrophils Relative %: 67 %

## 2021-09-22 LAB — CBC
HCT: 43.9 % (ref 36.0–46.0)
Hemoglobin: 14.4 g/dL (ref 12.0–15.0)
MCH: 30.1 pg (ref 26.0–34.0)
MCHC: 32.8 g/dL (ref 30.0–36.0)
MCV: 91.8 fL (ref 80.0–100.0)
Platelets: 263 10*3/uL (ref 150–400)
RBC: 4.78 MIL/uL (ref 3.87–5.11)
RDW: 12.7 % (ref 11.5–15.5)
WBC: 7.3 10*3/uL (ref 4.0–10.5)
nRBC: 0 % (ref 0.0–0.2)

## 2021-09-22 MED ORDER — SODIUM CHLORIDE 0.9 % IV BOLUS
1000.0000 mL | Freq: Once | INTRAVENOUS | Status: AC
  Administered 2021-09-22: 1000 mL via INTRAVENOUS

## 2021-09-22 MED ORDER — DIPHENHYDRAMINE HCL 50 MG/ML IJ SOLN
12.5000 mg | Freq: Once | INTRAMUSCULAR | Status: AC
  Administered 2021-09-22: 12.5 mg via INTRAVENOUS
  Filled 2021-09-22: qty 1

## 2021-09-22 MED ORDER — KETOROLAC TROMETHAMINE 15 MG/ML IJ SOLN
15.0000 mg | Freq: Once | INTRAMUSCULAR | Status: AC
  Administered 2021-09-22: 15 mg via INTRAVENOUS
  Filled 2021-09-22: qty 1

## 2021-09-22 MED ORDER — PROCHLORPERAZINE EDISYLATE 10 MG/2ML IJ SOLN
10.0000 mg | Freq: Once | INTRAMUSCULAR | Status: AC
  Administered 2021-09-22: 10 mg via INTRAVENOUS
  Filled 2021-09-22: qty 2

## 2021-09-22 MED ORDER — ACETAMINOPHEN 325 MG PO TABS
650.0000 mg | ORAL_TABLET | Freq: Once | ORAL | Status: AC
Start: 2021-09-22 — End: 2021-09-22
  Administered 2021-09-22: 650 mg via ORAL
  Filled 2021-09-22: qty 2

## 2021-09-22 NOTE — ED Provider Notes (Signed)
[facility] EMERGENCY DEPARTMENT Provider Note   CSN: [id number] Arrival date & time: 09/22/21  1610     History  Chief Complaint  Patient presents with   Dizziness   Headache    Nicole Little is a 41 y.o. female This is a patient with no significant past medical history presents with concern for feeling lightheaded while driving earlier today.  She did not lose consciousness but did feel like she was going to.  Of note she folic she had a fever this weekend, without cough, sore throat, significant diarrhea or vomiting.  She says that she feels overall better now.  Of note she also has a headache that she describes as a sharp pain on bilateral temples, she reports that it is slightly better than it normally is, she has very frequent headaches.  Patient has not been evaluated for frequent headaches in the past.   Dizziness Associated symptoms: headaches   Headache Associated symptoms: dizziness       Home Medications Prior to Admission medications   Medication Sig Start Date End Date Taking? Authorizing Provider  ferrous sulfate 325 (65 FE) MG tablet Take 325 mg by mouth daily with breakfast.    [provider]  ibuprofen (ADVIL,MOTRIN) 600 MG tablet Take 1 tablet (600 mg total) by mouth every 6 (six) hours. 03/16/17   Arabella Merles, CNM  Prenatal Vit-Fe Fumarate-FA (MULTIVITAMIN-PRENATAL) 27-0.8 MG TABS tablet Take 1 tablet by mouth daily.     [provider]      Allergies    Penicillins    Review of Systems   Review of Systems  Neurological:  Positive for dizziness and headaches.  All other systems reviewed and are negative.  Physical Exam Updated Vital Signs BP 109/65    Pulse 75    Temp 98.6 F (37 C)    Resp 18    SpO2 99%  Physical Exam Vitals and nursing note reviewed.  Constitutional:      General: She is not in acute distress.    Appearance: Normal appearance.  HENT:     Head: Normocephalic and atraumatic.   Eyes:     General:        Right eye: No discharge.        Left eye: No discharge.  Cardiovascular:     Rate and Rhythm: Normal rate and regular rhythm.     Heart sounds: No murmur heard.   No friction rub. No gallop.  Pulmonary:     Effort: Pulmonary effort is normal.     Breath sounds: Normal breath sounds.  Abdominal:     General: Bowel sounds are normal.     Palpations: Abdomen is soft.  Skin:    General: Skin is warm and dry.     Capillary Refill: Capillary refill takes less than 2 seconds.  Neurological:     Mental Status: She is alert and oriented to person, place, and time.     Comments: CN II through XII grossly intact.  Intact strength of bilateral upper and lower extremities.  Romberg negative, gait normal.  No dysdiadochokinesia.  Intact finger-nose.  Intact heel-to-shin.  Alert and oriented x3.  No pronator drift.  Moves all 4 limbs spontaneously with normal coordination.  Psychiatric:        Mood and Affect: Mood normal.        Behavior: Behavior normal.    ED Results / Procedures / Treatments   Labs (all labs ordered are  listed, but only abnormal results are displayed) Labs Reviewed  COMPREHENSIVE METABOLIC PANEL - Abnormal; Notable for the following components:      Result Value   Glucose, Bld 115 (*)    AST 49 (*)    ALT 89 (*)    All other components within normal limits  CBC  DIFFERENTIAL  I-STAT BETA HCG BLOOD, ED ([facility], [facility], [facility] ONLY)    EKG EKG Interpretation  Date/Time:  Thursday September 22 2021 18:37:43 EST Ventricular Rate:  76 PR Interval:  152 QRS Duration: 74 QT Interval:  372 QTC Calculation: 418 R Axis:   60 Text Interpretation: Normal sinus rhythm Normal ECG No previous ECGs available Confirmed by Alvester Chou [phone]) on 09/22/2021 8:31:30 PM  Radiology CT HEAD WO CONTRAST  Result Date: 09/22/2021 CLINICAL DATA:  Dizziness and headache. EXAM: CT HEAD WITHOUT CONTRAST TECHNIQUE: Contiguous axial images were obtained from the base of  the skull through the vertex without intravenous contrast. RADIATION DOSE REDUCTION: This exam was performed according to the departmental dose-optimization program which includes automated exposure control, adjustment of the mA and/or kV according to patient size and/or use of iterative reconstruction technique. COMPARISON:  None. FINDINGS: Brain: There is no evidence of an acute infarct, intracranial hemorrhage, mass, midline shift, or extra-axial fluid collection. The ventricles and sulci are normal. Vascular: No hyperdense vessel. Skull: No fracture or suspicious osseous lesion. Sinuses/Orbits: Mild mucosal thickening in the paranasal sinuses. Clear mastoid air cells. Unremarkable orbits. Other: None. IMPRESSION: Unremarkable CT appearance of the brain. Electronically Signed   By: Sebastian Ache M.D.   On: 09/22/2021 19:03    Procedures Procedures    Medications Ordered in ED Medications  sodium chloride 0.9 % bolus 1,000 mL (0 mLs Intravenous Stopped 09/22/21 2220)  ketorolac (TORADOL) 15 MG/ML injection 15 mg (15 mg Intravenous Given 09/22/21 2116)  acetaminophen (TYLENOL) tablet 650 mg (650 mg Oral Given 09/22/21 2109)  prochlorperazine (COMPAZINE) injection 10 mg (10 mg Intravenous Given 09/22/21 2117)  diphenhydrAMINE (BENADRYL) injection 12.5 mg (12.5 mg Intravenous Given 09/22/21 2115)    ED Course/ Medical Decision Making/ A&P                            Medical Decision Making Amount and/or Complexity of Data Reviewed Labs: ordered. Radiology: ordered.  Risk OTC drugs. Prescription drug management.   This is an overall well-appearing 41 year old woman with no significant past medical history who presents with dizziness versus lightheadedness that began yesterday at 80 when she was going to work.  Patient reports that she has been having some headaches for a while now, reports that she has had occasional numbness and tingling in the arms and hands.  Patient reports some difficulties  walking but denies unilateral weakness.  The emergent differential diagnosis for this patient includes stroke, enteritis, other acute intracranial abnormality, migraine, tension headache, dural venous thrombosis, versus other.  This is not an exhaustive differential.  Additional history obtained from patient's friend.  She has social determinant of health of not having significant medical care in the past, no current PCP.  She has never been evaluated for her headaches in the past.  Reports that they have been going on for years.  When she describes her headache today she reports that she does less bad than her typical.  Physical exam is remarkable for no acute neurologic deficits.  As patient reports that she was feeling febrile this weekend I suspect that patient  may have had a viral infection of some kind which is now resolved however she is having some lingering weakness, dehydration.   My attending Dr. Renaye Rakersrifan personally reviewed her EKG, I also reviewed and it shows an underlying rhythm of sinus.  Patient has no history of arrhythmias in the past.  I independently reviewed CT head without contrast which shows no acute intracranial abnormality.  I agree with radiologist interpretation.  Her blood work was unremarkable other than mild hyperglycemia, mild elevation of transaminases.  Negative pregnancy test, unremarkable CBC.  Patient appears stable for discharge at this time, encouraged follow-up with neurology for valuation of persistent headaches if they continue.  After administration of migraine cocktail patient reports that her headache is resolved.  Discharged in stable condition at this time, return precautions are given. Final Clinical Impression(s) / ED Diagnoses Final diagnoses:  Acute non intractable tension-type headache  Lightheadedness    Rx / DC Orders ED Discharge Orders     None         West Balirosperi, Armetta Henri H, PA-C 09/22/21 2338    Terald Sleeperrifan, Matthew J, MD 09/23/21  [phone]

## 2021-09-22 NOTE — ED Triage Notes (Signed)
Pt here for dizziness and HA since yesterday at 1530 when she was going to work. Pt states both have been constant and she has had some difficulty walking. Pt has no unilateral weakness, states she has had numbness and tingling in her arms and hands chronically due to job. Denies sensitivity to light and sounds

## 2021-09-22 NOTE — Discharge Instructions (Addendum)
Como comentamos, creo que el mareo que est experimentando es secundario a un poco de deshidratacin, posiblemente debido a una infeccin, ya que tuvo fiebre este fin de [place]. Sin embargo, dado que ha tenido dolores de cabeza durante mucho tiempo, puede valer la pena hablar con un neurlogo acerca de estos sntomas persistentes. Te recomiendo que bebas muchos lquidos, puedes probar con ibuprofeno, Tylenol para los dolores de [place]. Si tiene Herbalist de cabeza que [place], un dolor de cabeza que se siente diferente al habitual, regrese al departamento de emergencias para una evaluacin adicional. Espero que te sientas mejor pronto, fue un placer atenderte hoy.

## 2021-09-22 NOTE — ED Provider Triage Note (Signed)
Emergency Medicine Provider Triage Evaluation Note  Jessamy Torosyan , a 41 y.o. female  was evaluated in triage.  Pt complains of frontal headache onset yesterday. Her stabbing headache has been constant. She has associated dizziness, subjective fever. Has not tried any medications for her symptoms. Denies fever, chills, nausea, vomiting. Denies PMHx of HTN, DM, CVA.  Review of Systems  Positive: As per HPI above. Negative: Fever, chills, nausea  Physical Exam  BP 120/63 (BP Location: Left Arm)    Pulse 70    Temp 98.8 F (37.1 C) (Oral)    Resp 16    SpO2 95%  Gen:   Awake, no distress   Resp:  Normal effort  MSK:   Moves extremities without difficulty  Other:  No chest wall TTP. No focal neurological deficit.   Medical Decision Making  Medically screening exam initiated at 6:31 PM.  Appropriate orders placed.  Deavion E Sherlon Handing Chicas was informed that the remainder of the evaluation will be completed by another provider, this initial triage assessment does not replace that evaluation, and the importance of remaining in the ED until their evaluation is complete.   Bayne Fosnaugh A, PA-C 09/22/21 1836

## 2022-09-18 ENCOUNTER — Other Ambulatory Visit: Payer: Self-pay

## 2022-09-18 ENCOUNTER — Emergency Department (HOSPITAL_COMMUNITY)
Admission: EM | Admit: 2022-09-18 | Discharge: 2022-09-18 | Disposition: A | Discharge status: Home / Self Care | Payer: Self-pay | Attending: Emergency Medicine | Admitting: Emergency Medicine

## 2022-09-18 DIAGNOSIS — N6320 Unspecified lump in the left breast, unspecified quadrant: Secondary | ICD-10-CM | POA: Insufficient documentation

## 2022-09-18 DIAGNOSIS — J029 Acute pharyngitis, unspecified: Secondary | ICD-10-CM | POA: Insufficient documentation

## 2022-09-18 DIAGNOSIS — N63 Unspecified lump in unspecified breast: Secondary | ICD-10-CM

## 2022-09-18 LAB — GROUP A STREP BY PCR: Group A Strep by PCR: NOT DETECTED

## 2022-09-18 NOTE — ED Notes (Signed)
Dc instructions reviewed with pt no questions or concerns at this time. Will follow up with breast center.

## 2022-09-18 NOTE — ED Triage Notes (Signed)
Left breast abscess x 4 days, painful no drainage, has had fevers but thought it was a cold. Denies any injury.

## 2022-09-18 NOTE — Discharge Instructions (Addendum)
Please follow-up with the breast center of [facility] for mammogram.  You will need to call and make an appointment.  If you develop redness, swelling, warmth of the site please return to the ER.

## 2022-09-18 NOTE — ED Provider Notes (Signed)
[place] Provider Note   CSN: [id number] Arrival date & time: 09/18/22  1154     History  Chief Complaint  Patient presents with   Abscess    Nicole Little is a 42 y.o. female, no pertinent past medical history, who presents to the ED secondary to left breast pain for the last 4 days, she states that her breast has had a lump in on the left side of it, and has been very tender for the last couple days.  Also endorses some weight loss over the last month, but she thought it was secondary to stress.  Additionally states that she has had intermittent fevers, and believes that she may have a cold as she has a sore throat.  Denies any runny nose cough, chest pain, shortness of breath.     Home Medications Prior to Admission medications   Medication Sig Start Date End Date Taking? Authorizing Provider  ferrous sulfate 325 (65 FE) MG tablet Take 325 mg by mouth daily with breakfast.   Yes [provider]  ibuprofen (ADVIL,MOTRIN) 600 MG tablet Take 1 tablet (600 mg total) by mouth every 6 (six) hours. 03/16/17  Yes Serita Grammes D, CNM  Prenatal Vit-Fe Fumarate-FA (MULTIVITAMIN-PRENATAL) 27-0.8 MG TABS tablet Take 1 tablet by mouth daily.  Patient not taking: Reported on 09/18/2022    [provider]      Allergies    Penicillins    Review of Systems   Review of Systems  HENT:  Positive for sore throat.   Respiratory:  Negative for cough.   Skin:  Negative for rash.    Physical Exam Updated Vital Signs BP 113/80   Pulse 64   Temp 99.2 F (37.3 C) (Oral)   Resp 16   Ht 5\' 2"  (1.575 m)   Wt 77 kg   SpO2 100%   BMI 31.05 kg/m  Physical Exam Vitals and nursing note reviewed.  Constitutional:      General: She is not in acute distress.    Appearance: She is well-developed.  HENT:     Head: Normocephalic and atraumatic.     Mouth/Throat:     Mouth: Mucous membranes are moist.     Pharynx:  Posterior oropharyngeal erythema present. No oropharyngeal exudate.     Tonsils: 2+ on the right. 2+ on the left.  Eyes:     Conjunctiva/sclera: Conjunctivae normal.  Cardiovascular:     Rate and Rhythm: Normal rate and regular rhythm.     Heart sounds: No murmur heard. Pulmonary:     Effort: Pulmonary effort is normal. No respiratory distress.     Breath sounds: Normal breath sounds.  Chest:     Chest wall: Mass and tenderness present. No lacerations, swelling, crepitus or edema.    Abdominal:     Palpations: Abdomen is soft.     Tenderness: There is no abdominal tenderness.  Musculoskeletal:        General: No swelling.     Cervical back: Neck supple.  Skin:    General: Skin is warm and dry.     Capillary Refill: Capillary refill takes less than 2 seconds.  Neurological:     Mental Status: She is alert.  Psychiatric:        Mood and Affect: Mood normal.     ED Results / Procedures / Treatments   Labs (all labs ordered are listed, but only abnormal results are displayed) Labs Reviewed  GROUP A STREP BY PCR    EKG None  Radiology No results found.  Procedures Procedures    Medications Ordered in ED Medications - No data to display  ED Course/ Medical Decision Making/ A&P   {                           Medical Decision Making Patient is a 42 year old female, here for a mass on her left breast has been there for the last couple days that is very tender.  She denies any rash, swelling, warmth to the area, but she states it is very tender.  Has never had a mammogram before.  Also endorses some recent weight loss, she does have a knot on the left side of her breast, that is hard in nature, she and I discussed the need for follow-up with the breast cancer center for mammogram as she is 42 years old and has never had a mammogram.  There is no overlying erythema, warmth that would indicate a abscess.  She also reports that she has had somewhat of a sore throat, and  low-grade fever at home, we will test her for strep, for further evaluation.  She is overall well-appearing however.  Amount and/or Complexity of Data Reviewed Labs:     Details: Strep negative Discussion of management or test interpretation with external provider(s): Discussed with patient treatment for pharyngitis, and respiratory illnesses, we discussed the importance of follow-up with the breast cancer center for a mammogram, and follow-up with primary care doctor.   Final Clinical Impression(s) / ED Diagnoses Final diagnoses:  Breast mass in female  Pharyngitis, unspecified etiology    Rx / DC Orders ED Discharge Orders     None         Diamantina Monks, Si Gaul, PA 09/18/22 1536    Fredia Sorrow, MD 09/20/22 1907

## 2022-09-18 NOTE — ED Provider Triage Note (Signed)
Emergency Medicine Provider Triage Evaluation Note  Nicole Little , a 42 y.o. female  was evaluated in triage.  Pt complains of concerns for possible abscess to left lateral upper breast onset 4 days.  Denies history of similar symptoms.  Notes the area is painful however no drainage to the area.  No redness noted.  Has had fevers but thought it was a cold.  No injury.  Denies chest pain or shortness of breath.  Review of Systems  Positive:  Negative:  Physical Exam  BP 110/76   Pulse 68   Temp 99 F (37.2 C) (Oral)   Resp 18   Ht 5\' 2"  (1.575 m)   Wt 77 kg   SpO2 100%   BMI 31.05 kg/m  Gen:   Awake, no distress   Resp:  Normal effort  MSK:   Moves extremities without difficulty  Other:  Tenderness to palpation noted to left lateral upper chest wall without overlying skin changes.  Medical Decision Making  Medically screening exam initiated at 5:36 PM.  Appropriate orders placed.  Nicole Little was informed that the remainder of the evaluation will be completed by another provider, this initial triage assessment does not replace that evaluation, and the importance of remaining in the ED until their evaluation is complete.     Dorris Vangorder A, PA-C 09/18/22 1736

## 2022-09-20 ENCOUNTER — Other Ambulatory Visit: Payer: Self-pay

## 2022-09-20 DIAGNOSIS — N649 Disorder of breast, unspecified: Secondary | ICD-10-CM

## 2022-09-21 ENCOUNTER — Ambulatory Visit: Payer: Self-pay | Admitting: Hematology and Oncology

## 2022-09-21 VITALS — BP 128/69 | Wt 161.0 lb

## 2022-09-21 DIAGNOSIS — N6320 Unspecified lump in the left breast, unspecified quadrant: Secondary | ICD-10-CM

## 2022-09-21 NOTE — Progress Notes (Signed)
Ms. Buffi Ewton is a 42 y.o. female who presents to [facility] clinic today with complaint of left breast pain/ lump.    Pap Smear: Pap not smear completed today. Last Pap smear was 07/2022 and was normal. Per patient has no history of an abnormal Pap smear. Last Pap smear result is not available in Epic.   Physical exam: Breasts Breasts symmetrical. No skin abnormalities bilateral breasts. No nipple retraction bilateral breasts. No nipple discharge bilateral breasts. No lymphadenopathy. No lumps palpated bilateral breasts.       Pelvic/Bimanual Pap is not indicated today    Smoking History: Patient has never smoked and was not referred to quit line.    Patient Navigation: Patient education provided. Access to services provided for patient through [place] interpreter provided. No transportation provided   Colorectal Cancer Screening: Per patient has never had colonoscopy completed No complaints today.    Breast and Cervical Cancer Risk Assessment: Patient does not have family history of breast cancer, known genetic mutations, or radiation treatment to the chest before age 31. Patient does not have history of cervical dysplasia, immunocompromised, or DES exposure in-utero.  Risk Scores as of 09/21/2022     [place]aker Janus           5-year 0.5 %   Lifetime 9.03 %   This patient is Hisp[place]/Latina but has no documented birth country, so the [facility] used data from [place] patients to calculate their risk score. Document a birth country in the Demographics activity for a more accurate score.         Last calculated by Claretha Cooper, CMA on 09/21/2022 at  2:31 PM        A: [facility] exam without pap smear Complaint of left breast pain with small nodule noted at 3 o'clock 3 cm from the nipple.  P: Referred patient to the [facility] for a diagnostic mammogram. Appointment scheduled 09/26/22.  Melodye Ped, NP 09/21/2022 2:45 PM

## 2022-09-21 NOTE — Patient Instructions (Signed)
Taught Nicole Little about breast self awareness and gave educational materials to take home. Patient did not need a Pap smear today due to last Pap smear was in 2023 per patient. Let her know [facility] will cover Pap smears every 5 years unless has a history of abnormal Pap smears. Referred patient to the [facility] for diagnostic mammogram. Appointment scheduled for 09/26/2022. Patient aware of appointment and will be there. Let patient know will follow up with her within the next couple weeks with results. Nicole Little verbalized understanding.  Melodye Ped, NP 2:53 PM

## 2022-09-26 ENCOUNTER — Other Ambulatory Visit: Payer: Self-pay

## 2022-09-28 ENCOUNTER — Other Ambulatory Visit: Payer: Self-pay | Admitting: Obstetrics and Gynecology

## 2022-09-28 ENCOUNTER — Ambulatory Visit
Admission: RE | Admit: 2022-09-28 | Discharge: 2022-09-28 | Disposition: A | Discharge status: Home / Self Care | Payer: No Typology Code available for payment source | Source: Ambulatory Visit | Attending: Obstetrics and Gynecology | Admitting: Obstetrics and Gynecology

## 2022-09-28 ENCOUNTER — Ambulatory Visit
Admission: RE | Admit: 2022-09-28 | Discharge: 2022-09-28 | Disposition: A | Discharge status: Home / Self Care | Payer: Self-pay | Source: Ambulatory Visit | Attending: Obstetrics and Gynecology | Admitting: Obstetrics and Gynecology

## 2022-09-28 DIAGNOSIS — N649 Disorder of breast, unspecified: Secondary | ICD-10-CM

## 2022-09-28 DIAGNOSIS — N6320 Unspecified lump in the left breast, unspecified quadrant: Secondary | ICD-10-CM

## 2022-09-28 DIAGNOSIS — N6310 Unspecified lump in the right breast, unspecified quadrant: Secondary | ICD-10-CM

## 2022-10-04 ENCOUNTER — Ambulatory Visit
Admission: RE | Admit: 2022-10-04 | Discharge: 2022-10-04 | Disposition: A | Discharge status: Home / Self Care | Payer: No Typology Code available for payment source | Source: Ambulatory Visit | Attending: Obstetrics and Gynecology | Admitting: Obstetrics and Gynecology

## 2022-10-04 DIAGNOSIS — N6320 Unspecified lump in the left breast, unspecified quadrant: Secondary | ICD-10-CM

## 2022-10-04 DIAGNOSIS — N6310 Unspecified lump in the right breast, unspecified quadrant: Secondary | ICD-10-CM

## 2022-10-04 HISTORY — PX: BREAST BIOPSY: SHX20

## 2023-02-27 ENCOUNTER — Other Ambulatory Visit: Payer: Self-pay | Admitting: Obstetrics and Gynecology

## 2023-02-27 DIAGNOSIS — N6320 Unspecified lump in the left breast, unspecified quadrant: Secondary | ICD-10-CM

## 2023-04-09 ENCOUNTER — Ambulatory Visit
Admission: RE | Admit: 2023-04-09 | Discharge: 2023-04-09 | Disposition: A | Discharge status: Home / Self Care | Payer: No Typology Code available for payment source | Source: Ambulatory Visit | Attending: Obstetrics and Gynecology | Admitting: Obstetrics and Gynecology

## 2023-04-09 DIAGNOSIS — N6320 Unspecified lump in the left breast, unspecified quadrant: Secondary | ICD-10-CM

## 2023-06-22 ENCOUNTER — Encounter (HOSPITAL_COMMUNITY): Payer: Self-pay

## 2023-06-22 ENCOUNTER — Inpatient Hospital Stay (HOSPITAL_COMMUNITY)
Admission: AD | Admit: 2023-06-22 | Discharge: 2023-06-22 | Disposition: A | Discharge status: Home / Self Care | Payer: No Typology Code available for payment source | Attending: Obstetrics & Gynecology | Admitting: Obstetrics & Gynecology

## 2023-06-22 ENCOUNTER — Inpatient Hospital Stay (HOSPITAL_COMMUNITY): Payer: No Typology Code available for payment source

## 2023-06-22 DIAGNOSIS — Z3A01 Less than 8 weeks gestation of pregnancy: Secondary | ICD-10-CM

## 2023-06-22 DIAGNOSIS — O3680X0 Pregnancy with inconclusive fetal viability, not applicable or unspecified: Secondary | ICD-10-CM

## 2023-06-22 DIAGNOSIS — R141 Gas pain: Secondary | ICD-10-CM

## 2023-06-22 DIAGNOSIS — O26851 Spotting complicating pregnancy, first trimester: Secondary | ICD-10-CM | POA: Insufficient documentation

## 2023-06-22 DIAGNOSIS — O26891 Other specified pregnancy related conditions, first trimester: Secondary | ICD-10-CM | POA: Insufficient documentation

## 2023-06-22 HISTORY — DX: Headache, unspecified: R51.9

## 2023-06-22 HISTORY — DX: Depression, unspecified: F32.A

## 2023-06-22 LAB — POCT PREGNANCY, URINE: Preg Test, Ur: POSITIVE — AB

## 2023-06-22 MED ORDER — SIMETHICONE 80 MG PO TABS: 80.0000 mg | ORAL_TABLET | Freq: Three times a day (TID) | ORAL | 0 refills | Status: DC | PRN

## 2023-06-22 NOTE — MAU Note (Signed)
.  Nicole Little is a 42 y.o. at Unknown here in MAU reporting:   Took UPT in health dept on Monday (+), reports is approx [redacted] weeks pregnant. Patient has paper from health dept visit at visit.  Patient reports began having vaginal bleeding yesterday reports was only when she wiped. Reports came in today because she woke up around 0530 and had blood on her mattress that was brown and a little red. Patient denies wearing a pad at this time and reports bleeding is only when she wipes and is brown in color, has not passed any clots. Reports lower abdominal pressure pain that began when she woke up.     LMP: 03/02/2023 Onset of complaint: Yesterday  Pain score: 5/10 There were no vitals filed for this visit.   FHT:n/a  Lab orders placed from triage:

## 2023-06-22 NOTE — MAU Note (Signed)
Sheer dark reddish brown, only when she wipes.  Cramping in lower abd.  Denies urinary symptoms, diarrhea or constipation.  No recent intercourse.

## 2023-06-22 NOTE — MAU Provider Note (Signed)
Event Date/Time   First Provider Initiated Contact with Patient 06/22/23 [phone]    Used in person Spanish Interpreter throughout encounter.   S Ms. Nicole Little Deonka Donohoe is a 42 y.o. [phone] pregnant female at [redacted]w[redacted]d who presents to MAU today with complaint of VB.  Orginially presented to main ED and transferred here as presented paperwork from [facility] showing positive UPT on 06/18/23.  Pt states VB started 06/21/23 and was only when she wiped.  However, awoke around 0530 10/25 to find blood on mattress that was brown with some bright red.  Denies clots. [facility] documentation not available in media. States that the bleeding has improved and cramping as well.   Patient also noted some RUQ discomfort only in the afternoons, not associated with meals or associated constipation.  However, describes as shortly after getting this discomfort it spreads out throughout her abdomin.   Pertinent items noted in HPI and remainder of comprehensive ROS otherwise negative.   O BP 121/72 (BP Location: Right Arm)   Pulse 79   Temp 98.1 F (36.7 C) (Oral)   Resp 18   Ht 5\' 1"  (1.549 m)   Wt 74 kg   LMP 03/02/2023 (Approximate)   SpO2 100%   BMI 30.82 kg/m  Physical Exam Vitals and nursing note reviewed.  Constitutional:      General: She is not in acute distress.    Appearance: Normal appearance. She is not ill-appearing.  HENT:     Head: Normocephalic and atraumatic.     Right Ear: External ear normal.     Left Ear: External ear normal.     Nose: Nose normal.     Mouth/Throat:     Mouth: Mucous membranes are moist.     Pharynx: Oropharynx is clear.  Eyes:     Extraocular Movements: Extraocular movements intact.     Conjunctiva/sclera: Conjunctivae normal.  Cardiovascular:     Rate and Rhythm: Normal rate.  Pulmonary:     Effort: Pulmonary effort is normal. No respiratory distress.  Abdominal:     General: Abdomen is flat. There is no distension.     Palpations: Abdomen is soft.     Tenderness:  There is no abdominal tenderness.  Musculoskeletal:     Cervical back: Normal range of motion.  Skin:    General: Skin is warm and dry.  Neurological:     Mental Status: She is alert and oriented to person, place, and time. Mental status is at baseline.     Motor: No weakness.     Gait: Gait normal.  Psychiatric:        Mood and Affect: Mood normal.        Behavior: Behavior normal.        Thought Content: Thought content normal.        Judgment: Judgment normal.      MDM: MAU Course: Upreg positive  [place] report = single IUP at [redacted]w[redacted]d, no fetal cardiac activity, CRL <96mm so cannot definitely say nonviable, Recommend follow-up US in 10-14 days for definitive diagnosis    A&P: #[redacted] weeks gestation #Uncertain viability Community message to [facility] to schedule Viability [place] in 10-14 days.Given Preterm Labor / Miscarriage information at discharge.  #Gas pain Sent prescription for simethicone   Discharge from MAU in stable condition with strict/usual precautions Follow up at [facility] as scheduled for ongoing prenatal care  Allergies as of 06/22/2023       Reactions   Penicillins Rash, Other (See Comments)  Has patient had a PCN reaction causing immediate rash, facial/tongue/throat swelling, NO,SOB or lightheadedness with hypotension: No Has patient had a PCN reaction causing severe rash involving mucus membranes or skin necrosis: No Has patient had a PCN reaction that required hospitalization: No Has patient had a PCN reaction occurring within the last 10 years: No If all of the above answers are "NO", then may proceed with Cephalosporin use. Rash and itching on skin only- with first preg 2004        Medication List     TAKE these medications    ferrous sulfate 325 (65 FE) MG tablet Take 325 mg by mouth daily with breakfast.   multivitamin-prenatal 27-0.8 MG Tabs tablet Take 1 tablet by mouth daily.   OVER THE COUNTER MEDICATION as needed. Aspirin. Pt does not know the  dose.   Simethicone 80 MG Tabs Take 1 tablet (80 mg total) by mouth 3 (three) times daily as needed (gas discomfort).        Hessie Dibble, MD 06/22/2023 10:13 AM

## 2023-06-30 ENCOUNTER — Inpatient Hospital Stay (HOSPITAL_COMMUNITY)
Admission: AD | Admit: 2023-06-30 | Discharge: 2023-06-30 | Disposition: A | Discharge status: Home / Self Care | Payer: No Typology Code available for payment source | Attending: Obstetrics and Gynecology | Admitting: Obstetrics and Gynecology

## 2023-06-30 ENCOUNTER — Other Ambulatory Visit: Payer: Self-pay

## 2023-06-30 ENCOUNTER — Inpatient Hospital Stay (HOSPITAL_COMMUNITY): Payer: No Typology Code available for payment source

## 2023-06-30 DIAGNOSIS — Z3A01 Less than 8 weeks gestation of pregnancy: Secondary | ICD-10-CM | POA: Insufficient documentation

## 2023-06-30 DIAGNOSIS — O3680X0 Pregnancy with inconclusive fetal viability, not applicable or unspecified: Secondary | ICD-10-CM | POA: Insufficient documentation

## 2023-06-30 DIAGNOSIS — O200 Threatened abortion: Secondary | ICD-10-CM | POA: Insufficient documentation

## 2023-06-30 LAB — WET PREP, GENITAL
Clue Cells Wet Prep HPF POC: NONE SEEN
Sperm: NONE SEEN
Trich, Wet Prep: NONE SEEN
WBC, Wet Prep HPF POC: <10 (ref <10)
Yeast Wet Prep HPF POC: NONE SEEN

## 2023-06-30 LAB — URINALYSIS, ROUTINE W REFLEX MICROSCOPIC
Bilirubin Urine: NEGATIVE
Glucose, UA: NEGATIVE mg/dL
Hgb urine dipstick: NEGATIVE
Ketones, ur: NEGATIVE mg/dL
Leukocytes,Ua: NEGATIVE
Nitrite: NEGATIVE
Protein, ur: NEGATIVE mg/dL
Specific Gravity, Urine: 1.018 (ref 1.005–1.030)
pH: 6 (ref 5.0–8.0)

## 2023-06-30 NOTE — MAU Provider Note (Signed)
History     CSN: [id number]  Arrival date and time: 06/30/23 1338   Event Date/Time   First Provider Initiated Contact with Patient 06/30/23 1701      Chief Complaint  Patient presents with   Vaginal Bleeding   Abdominal Pain   HPI Nicole Little is a 42 y.o. [id number] at [redacted]w[redacted]d who presents with vaginal bleeding. Was seen on 10/25 for same. Ultrasound shows IUP with CRL 5.7 mm & no cardiac activity.  Since then has continued to notice dark red vaginal bleeding & abdominal cramping. Not bleeding into a pad or passing clots. Denies fever/chills.    OB History     Gravida  5   Para  4   Term  3   Preterm  1   AB  0   Living  3      SAB  0   IAB  0   Ectopic  0   Multiple  0   Live Births  3           Past Medical History:  Diagnosis Date   Depression    Headache     Past Surgical History:  Procedure Laterality Date   BREAST BIOPSY Right 10/04/2022   [place] RT BREAST BX W LOC DEV 1ST LESION IMG BX SPEC US GUIDE 10/04/2022 [facility]   BREAST BIOPSY Left 10/04/2022   [place] LT BREAST BX W LOC DEV 1ST LESION IMG BX SPEC US GUIDE 10/04/2022 [facility]    Family History  Problem Relation Age of Onset   Diabetes Mother    Diabetes Father    Kidney disease Father        dialysis   BRCA 1/2 Neg Hx    Breast cancer Neg Hx     Social History   Tobacco Use   Smoking status: Never   Smokeless tobacco: Never  Vaping Use   Vaping status: Never Used  Substance Use Topics   Alcohol use: Not Currently   Drug use: No    Allergies:  Allergies  Allergen Reactions   Penicillins Rash and Other (See Comments)    Has patient had a PCN reaction causing immediate rash, facial/tongue/throat swelling, NO,SOB or lightheadedness with hypotension: No Has patient had a PCN reaction causing severe rash involving mucus membranes or skin necrosis: No Has patient had a PCN reaction that required hospitalization: No Has patient had a PCN reaction occurring  within the last 10 years: No If all of the above answers are "NO", then may proceed with Cephalosporin use. Rash and itching on skin only- with first preg 2004    Medications Prior to Admission  Medication Sig Dispense Refill Last Dose   ferrous sulfate 325 (65 FE) MG tablet Take 325 mg by mouth daily with breakfast.      OVER THE COUNTER MEDICATION as needed. Aspirin. Pt does not know the dose.      Prenatal Vit-Fe Fumarate-FA (MULTIVITAMIN-PRENATAL) 27-0.8 MG TABS tablet Take 1 tablet by mouth daily.      Simethicone 80 MG TABS Take 1 tablet (80 mg total) by mouth 3 (three) times daily as needed (gas discomfort). 30 tablet 0     Review of Systems  All other systems reviewed and are negative.  Physical Exam   Blood pressure 104/72, pulse 69, temperature 98.7 F (37.1 C), temperature source Oral, resp. rate 18, height 5\' 1"  (1.549 m), weight 72.3 kg, last menstrual period 03/02/2023, SpO2 100%.  Physical Exam Vitals and  nursing note reviewed.  Constitutional:      General: She is not in acute distress.    Appearance: She is well-developed. She is not ill-appearing.  HENT:     Head: Normocephalic and atraumatic.  Eyes:     General: No scleral icterus.       Right eye: No discharge.        Left eye: No discharge.     Conjunctiva/sclera: Conjunctivae normal.  Pulmonary:     Effort: Pulmonary effort is normal. No respiratory distress.  Neurological:     General: No focal deficit present.     Mental Status: She is alert.  Psychiatric:        Mood and Affect: Mood normal.        Behavior: Behavior normal.     MAU Course  Procedures Results for orders placed or performed during the hospital encounter of 06/30/23 (from the past 24 hour(s))  Urinalysis, Routine w reflex microscopic -Urine, Clean Catch     Status: None   Collection Time: 06/30/23  2:48 PM  Result Value Ref Range   Color, Urine YELLOW YELLOW   APPearance CLEAR CLEAR   Specific Gravity, Urine 1.018 1.005 -  1.030   pH 6.0 5.0 - 8.0   Glucose, UA NEGATIVE NEGATIVE mg/dL   Hgb urine dipstick NEGATIVE NEGATIVE   Bilirubin Urine NEGATIVE NEGATIVE   Ketones, ur NEGATIVE NEGATIVE mg/dL   Protein, ur NEGATIVE NEGATIVE mg/dL   Nitrite NEGATIVE NEGATIVE   Leukocytes,Ua NEGATIVE NEGATIVE  Wet prep, genital     Status: None   Collection Time: 06/30/23  3:07 PM  Result Value Ref Range   Yeast Wet Prep HPF POC NONE SEEN NONE SEEN   Trich, Wet Prep NONE SEEN NONE SEEN   Clue Cells Wet Prep HPF POC NONE SEEN NONE SEEN   WBC, Wet Prep HPF POC <10 <10   Sperm NONE SEEN    US OB Transvaginal  Result Date: 06/30/2023 CLINICAL DATA:  Pregnant.  Bleeding. EXAM: OBSTETRIC <14 WK [place] AND TRANSVAGINAL OB US TECHNIQUE: Both transabdominal and transvaginal ultrasound examinations were performed for complete evaluation of the gestation as well as the maternal uterus, adnexal regions, and pelvic cul-de-sac. Transvaginal technique was performed to assess early pregnancy. COMPARISON:  Ultrasound 06/22/2023. FINDINGS: Intrauterine gestational sac: Single. Persistent chorioamniotic separation. Yolk sac:  Visualized. Embryo:  Visualized. Cardiac Activity: Not Visualized. CRL: 5.8 mm 6 w 3 d however on the prior measured 5.7 mm. No significant interval growth Subchorionic hemorrhage:  Small Maternal uterus/adnexae: Ovaries were not measured by the sonographer. What appears to be left ovary has small follicles. Right ovary is poorly seen on these limited images. No significant free fluid in the pelvis. IMPRESSION: Intrauterine pregnancy again identified but no fetal heart motion. There has been no significant interval growth since the prior examination 1 week ago. Please correlate for fetal demise. Recommend close follow-up with serial beta HCG and ultrasound. Poor visualization of the ovaries and elsewhere in the pelvis. Electronically Signed   By: Karen Kays M.D.   On: 06/30/2023 16:22    MDM U/a, wet prep, GC/CT, & ultrasound  ordered  Assessment and Plan   1. Pregnancy with inconclusive fetal viability, single or unspecified fetus   2. Threatened miscarriage   3. [redacted] weeks gestation of pregnancy    -Ultrasound today continues to show IUP with no cardiac activity. CRL now 5.8 mm. Does not yet meet criteria for failed pregnancy as it has only been 8  days since last scan & CRL <59mm. Discussed with patient this is likely failed pregnancy but will get follow up scan to confirm diagnosis. Scheduled for outpatient viability scan on 11/11 (patient not available for earlier dates). -Reviewed bleeding/infection/pain precautions & reasons to return -RH positive  -[facility] provided Spanish interpreter available for this encounter (Raquel)  Judeth Horn 06/30/2023, 5:01 PM

## 2023-06-30 NOTE — Discharge Instructions (Signed)
Return to care  If you have heavier bleeding that soaks through more than 2 pads per hour for an hour or more If you bleed so much that you feel like you might pass out or you do pass out If you have significant abdominal pain that is not improved with Tylenol   

## 2023-06-30 NOTE — MAU Note (Signed)
Nicole Little is a 42 y.o. at [redacted]w[redacted]d here in MAU reporting: she's spotting with wiping.  Reports spotting began last week, was brown but now is more red.  Also endorses lower back and abdominal pain.  Denies taking meds to treat discomfort. LMP: 03/02/2023 Onset of complaint: last week Pain score: 5 Vitals:   06/30/23 1400  BP: 104/72  Pulse: 69  Resp: 18  Temp: 98.7 F (37.1 C)  SpO2: 100%     FHT:NA Lab orders placed from triage:   UA

## 2023-07-02 LAB — GC/CHLAMYDIA PROBE AMP (~~LOC~~) NOT AT ARMC
Chlamydia: NEGATIVE
Comment: NEGATIVE
Comment: NORMAL
Neisseria Gonorrhea: NEGATIVE

## 2023-07-05 ENCOUNTER — Other Ambulatory Visit: Payer: No Typology Code available for payment source

## 2023-07-09 ENCOUNTER — Other Ambulatory Visit: Payer: Self-pay

## 2023-07-09 ENCOUNTER — Ambulatory Visit (INDEPENDENT_AMBULATORY_CARE_PROVIDER_SITE_OTHER): Payer: Self-pay

## 2023-07-09 DIAGNOSIS — Z3A00 Weeks of gestation of pregnancy not specified: Secondary | ICD-10-CM

## 2023-07-09 DIAGNOSIS — O3680X0 Pregnancy with inconclusive fetal viability, not applicable or unspecified: Secondary | ICD-10-CM

## 2023-07-10 ENCOUNTER — Encounter: Payer: Self-pay | Admitting: *Deleted

## 2023-07-10 DIAGNOSIS — O09299 Supervision of pregnancy with other poor reproductive or obstetric history, unspecified trimester: Secondary | ICD-10-CM | POA: Insufficient documentation

## 2023-07-10 DIAGNOSIS — O09293 Supervision of pregnancy with other poor reproductive or obstetric history, third trimester: Secondary | ICD-10-CM

## 2023-07-10 DIAGNOSIS — O0990 Supervision of high risk pregnancy, unspecified, unspecified trimester: Secondary | ICD-10-CM | POA: Insufficient documentation

## 2023-07-10 DIAGNOSIS — O09529 Supervision of elderly multigravida, unspecified trimester: Secondary | ICD-10-CM | POA: Insufficient documentation

## 2023-07-13 ENCOUNTER — Telehealth: Payer: Self-pay | Admitting: Student

## 2023-07-13 NOTE — Telephone Encounter (Signed)
Left voicemail for patient to return call.  Need to speak with patient regarding her ultrasound earlier this week. Unclear per chart review if she has been notified of miscarriage.   Voicemail left via Spanish interpreter Albertina Parr #[phone]   Judeth Horn

## 2023-07-16 NOTE — Telephone Encounter (Signed)
Called pt with [facility] interpreter [place]. Results reviewed. Pt reports having heavy bleeding on Sunday, 07/08/23. Reports passing clots with tissue around time of ultrasound. Pt was aware of miscarriage based on her symptoms. Would like to follow up with provider. Changed new OB visit to GYN visit for SAB follow up with Alvester Morin, MD.

## 2023-07-23 ENCOUNTER — Ambulatory Visit: Payer: Self-pay | Admitting: Family Medicine

## 2023-10-25 ENCOUNTER — Telehealth: Payer: Self-pay

## 2023-10-25 NOTE — Telephone Encounter (Signed)
 Telephoned patient at mobile number using interpreter, Delorise Royals. No answer, mailbox not an option. [facility]

## 2023-11-06 NOTE — Telephone Encounter (Signed)
 Telephoned patient using interpreter, Nicole Little. Patient has private insurance and given [facility] scheduling telephone number.

## 2023-12-05 ENCOUNTER — Encounter: Payer: Self-pay | Admitting: Obstetrics and Gynecology

## 2023-12-05 DIAGNOSIS — N6320 Unspecified lump in the left breast, unspecified quadrant: Secondary | ICD-10-CM

## 2023-12-20 ENCOUNTER — Other Ambulatory Visit: Payer: Self-pay | Admitting: Nurse Practitioner

## 2023-12-20 DIAGNOSIS — N6320 Unspecified lump in the left breast, unspecified quadrant: Secondary | ICD-10-CM

## 2024-02-01 ENCOUNTER — Ambulatory Visit
Admission: RE | Admit: 2024-02-01 | Discharge: 2024-02-01 | Disposition: A | Discharge status: Home / Self Care | Payer: Self-pay | Source: Ambulatory Visit | Attending: Nurse Practitioner | Admitting: Nurse Practitioner

## 2024-02-01 DIAGNOSIS — N6320 Unspecified lump in the left breast, unspecified quadrant: Secondary | ICD-10-CM

## 2024-03-27 LAB — OB RESULTS CONSOLE HEPATITIS B SURFACE ANTIGEN: Hepatitis B Surface Ag: NEGATIVE

## 2024-03-27 LAB — HEPATIC FUNCTION PANEL
ALT: 57 U/L — AB (ref 7–35)
AST: 29 (ref 13–35)
Alkaline Phosphatase: 79 (ref 25–125)

## 2024-03-27 LAB — BASIC METABOLIC PANEL WITH GFR
BUN: 13 (ref 4–21)
Chloride: 102 (ref 99–108)
Creatinine: 0.5 (ref 0.5–1.1)
Potassium: 4.3 meq/L (ref 3.5–5.1)
Sodium: 137 (ref 137–147)

## 2024-03-27 LAB — OB RESULTS CONSOLE RPR: RPR: NONREACTIVE

## 2024-03-27 LAB — OB RESULTS CONSOLE HGB/HCT, BLOOD
HCT: 40 (ref 29–41)
Hemoglobin: 13.4

## 2024-03-27 LAB — OB RESULTS CONSOLE PLATELET COUNT: Platelets: 266

## 2024-03-27 LAB — OB RESULTS CONSOLE RUBELLA ANTIBODY, IGM: Rubella: IMMUNE

## 2024-03-27 LAB — OB RESULTS CONSOLE GC/CHLAMYDIA
Chlamydia: NEGATIVE
Neisseria Gonorrhea: NEGATIVE

## 2024-03-27 LAB — OB RESULTS CONSOLE HIV ANTIBODY (ROUTINE TESTING): HIV: NONREACTIVE

## 2024-03-27 LAB — OB RESULTS CONSOLE ANTIBODY SCREEN: Antibody Screen: NEGATIVE

## 2024-03-27 LAB — OB RESULTS CONSOLE ABO/RH: RH Type: POSITIVE

## 2024-03-27 LAB — HEPATITIS C ANTIBODY: HCV Ab: NEGATIVE

## 2024-03-27 LAB — OB RESULTS CONSOLE VARICELLA ZOSTER ANTIBODY, IGG: Varicella: IMMUNE

## 2024-04-01 ENCOUNTER — Other Ambulatory Visit: Payer: Self-pay

## 2024-04-01 DIAGNOSIS — Z36 Encounter for antenatal screening for chromosomal anomalies: Secondary | ICD-10-CM

## 2024-04-01 DIAGNOSIS — Z315 Encounter for genetic counseling: Secondary | ICD-10-CM

## 2024-04-25 LAB — GLUCOSE TOLERANCE, 1 HOUR: GTT, 1 hr: 88

## 2024-04-29 ENCOUNTER — Ambulatory Visit (INDEPENDENT_AMBULATORY_CARE_PROVIDER_SITE_OTHER): Admitting: Obstetrics & Gynecology

## 2024-04-29 ENCOUNTER — Other Ambulatory Visit: Payer: Self-pay

## 2024-04-29 ENCOUNTER — Encounter: Payer: Self-pay | Admitting: Obstetrics & Gynecology

## 2024-04-29 VITALS — BP 112/71 | HR 100 | Wt 162.0 lb

## 2024-04-29 DIAGNOSIS — Z3A14 14 weeks gestation of pregnancy: Secondary | ICD-10-CM

## 2024-04-29 DIAGNOSIS — O0992 Supervision of high risk pregnancy, unspecified, second trimester: Secondary | ICD-10-CM

## 2024-04-29 DIAGNOSIS — O09522 Supervision of elderly multigravida, second trimester: Secondary | ICD-10-CM | POA: Diagnosis not present

## 2024-04-29 DIAGNOSIS — Z603 Acculturation difficulty: Secondary | ICD-10-CM

## 2024-04-29 DIAGNOSIS — Z758 Other problems related to medical facilities and other health care: Secondary | ICD-10-CM | POA: Diagnosis not present

## 2024-04-29 DIAGNOSIS — Z1332 Encounter for screening for maternal depression: Secondary | ICD-10-CM | POA: Diagnosis not present

## 2024-04-29 DIAGNOSIS — O09529 Supervision of elderly multigravida, unspecified trimester: Secondary | ICD-10-CM

## 2024-04-29 DIAGNOSIS — O0990 Supervision of high risk pregnancy, unspecified, unspecified trimester: Secondary | ICD-10-CM | POA: Insufficient documentation

## 2024-04-29 MED ORDER — MECLIZINE HCL 12.5 MG PO TABS: 12.5000 mg | ORAL_TABLET | Freq: Three times a day (TID) | ORAL | 0 refills | Status: DC | PRN

## 2024-04-29 NOTE — Progress Notes (Signed)
  Subjective:    Nicole Little is a [id number] [redacted]w[redacted]d being seen today for her first obstetrical visit.  Her obstetrical history is significant for advanced maternal age. Patient does intend to breast feed. Pregnancy history fully reviewed.  Patient reports dizziness.  Vitals:   04/29/24 1413  BP: 112/71  Pulse: 100  Weight: 162 lb (73.5 kg)    HISTORY: OB History  Gravida Para Term Preterm AB Living  6 4 3 1 1 3   SAB IAB Ectopic Multiple Live Births  1 0 0 0 3    # Outcome Date GA Lbr Len/2nd Weight Sex Type Anes PTL Lv  6 Current           5 SAB 06/2023 [redacted]w[redacted]d    SAB     4 Term 03/14/17 [redacted]w[redacted]d 06:58 / 00:07 6 lb 12.8 oz (3.084 kg) F Vag-Spont None  LIV  3 Term 05/08/06 [redacted]w[redacted]d  6 lb 7 oz (2.92 kg) F Vag-Spont   LIV  2 Term 06/29/04 [redacted]w[redacted]d  6 lb 10 oz (3.005 kg) M Vag-Spont   LIV  1 Preterm 06/14/03 [redacted]w[redacted]d  4 lb (1.814 kg)  Vag-Spont   FD    Obstetric Comments  36 week IUFD in 2004   Past Medical History:  Diagnosis Date   Depression    Headache    Past Surgical History:  Procedure Laterality Date   BREAST BIOPSY Right 10/04/2022   US  RT BREAST BX W LOC DEV 1ST LESION IMG BX SPEC US  GUIDE 10/04/2022 [facility]   BREAST BIOPSY Left 10/04/2022   US  LT BREAST BX W LOC DEV 1ST LESION IMG BX SPEC US  GUIDE 10/04/2022 [facility]   Family History  Problem Relation Age of Onset   Diabetes Mother    Diabetes Father    Kidney disease Father        dialysis   BRCA 1/2 Neg Hx    Breast cancer Neg Hx      Exam    Uterus:   14  Pelvic Exam:    Perineum:    Vulva:    Vagina:     pH:    Cervix:    Adnexa:    Bony Pelvis:   System: Breast:  Inspection negative   Skin: normal coloration and turgor, no rashes    Neurologic: oriented, normal mood   Extremities: normal strength, tone, and muscle mass   HEENT PERRLA and neck supple with midline trachea   Mouth/Teeth mucous membranes moist, pharynx normal without lesions   Neck supple   Cardiovascular:  regular rate and rhythm   Respiratory:  appears well, vitals normal, no respiratory distress, acyanotic, normal RR   Abdomen: soft, non-tender; bowel sounds normal; no masses,  no organomegaly   Urinary:       Assessment:    Pregnancy: [id number] Patient Active Problem List   Diagnosis Date Noted   Supervision of high risk pregnancy, antepartum 04/29/2024   AMA (advanced maternal age) multigravida 35+ 07/10/2023   Language barrier 11/02/2016   History of IUFD 10/05/2016        Plan:     Initial labs drawn. Prenatal vitamins. Problem list reviewed and updated. Genetic Screening discussed : ordered.  Ultrasound discussed; fetal survey: ordered.  Follow up in 4 weeks. 50% of 30 min visit spent on counseling and coordination of care.     Lynwood Solomons 04/29/2024

## 2024-05-27 ENCOUNTER — Ambulatory Visit (INDEPENDENT_AMBULATORY_CARE_PROVIDER_SITE_OTHER): Admitting: Family Medicine

## 2024-05-27 ENCOUNTER — Other Ambulatory Visit: Payer: Self-pay

## 2024-05-27 VITALS — BP 124/78 | HR 98 | Wt 165.0 lb

## 2024-05-27 DIAGNOSIS — Z603 Acculturation difficulty: Secondary | ICD-10-CM

## 2024-05-27 DIAGNOSIS — Z3A18 18 weeks gestation of pregnancy: Secondary | ICD-10-CM

## 2024-05-27 DIAGNOSIS — O09522 Supervision of elderly multigravida, second trimester: Secondary | ICD-10-CM

## 2024-05-27 DIAGNOSIS — Z8759 Personal history of other complications of pregnancy, childbirth and the puerperium: Secondary | ICD-10-CM

## 2024-05-27 DIAGNOSIS — Z758 Other problems related to medical facilities and other health care: Secondary | ICD-10-CM

## 2024-05-27 DIAGNOSIS — O0990 Supervision of high risk pregnancy, unspecified, unspecified trimester: Secondary | ICD-10-CM

## 2024-05-27 DIAGNOSIS — O0992 Supervision of high risk pregnancy, unspecified, second trimester: Secondary | ICD-10-CM | POA: Diagnosis not present

## 2024-05-27 DIAGNOSIS — Z3009 Encounter for other general counseling and advice on contraception: Secondary | ICD-10-CM

## 2024-05-27 NOTE — Progress Notes (Signed)
   Subjective:  Nicole Little is a 43 y.o. [id number] at [redacted]w[redacted]d being seen today for ongoing prenatal care.  She is currently monitored for the following issues for this high-risk pregnancy and has History of IUFD; Language barrier; AMA (advanced maternal age) multigravida 35+; and Supervision of high risk pregnancy, antepartum on their problem list.  Patient reports no complaints.  Contractions: Not present. Vag. Bleeding: None.  Movement: Absent. Denies leaking of fluid.   The following portions of the patient's history were reviewed and updated as appropriate: allergies, current medications, past family history, past medical history, past social history, past surgical history and problem list. Problem list updated.  Objective:   Vitals:   05/27/24 1323  BP: 124/78  Pulse: 98  Weight: 165 lb (74.8 kg)    Fetal Status: Fetal Heart Rate (bpm): 145   Movement: Absent     General:  Alert, oriented and cooperative. Patient is in no acute distress.  Skin: Skin is warm and dry. No rash noted.   Cardiovascular: Normal heart rate noted  Respiratory: Normal respiratory effort, no problems with respiration noted  Abdomen: Soft, gravid, appropriate for gestational age. Pain/Pressure: Absent     Pelvic: Vag. Bleeding: None     Cervical exam deferred        Extremities: Normal range of motion.  Edema: None  Mental Status: Normal mood and affect. Normal behavior. Normal judgment and thought content.   Urinalysis:      Assessment and Plan:  Pregnancy: H3E6886 at [redacted]w[redacted]d  1. [redacted] weeks gestation of pregnancy   2. Supervision of high risk pregnancy, antepartum (Primary) BP and FHR normal AFP today Has anatomy scan scheduled for tomorrow - AFP, Serum, Open Spina Bifida  3. Multigravida of advanced maternal age in second trimester ASA 81 mg  4. Language barrier Spanish  5. History of IUFD With first pregnancy, normal deliveries x3 NSVD Antenatal testing per MFM guidelines  6.  Unwanted fertility Confirmed she desires postpartum BTL Has BCBS insurance  Preterm labor symptoms and general obstetric precautions including but not limited to vaginal bleeding, contractions, leaking of fluid and fetal movement were reviewed in detail with the patient. Please refer to After Visit Summary for other counseling recommendations.  Return in 4 weeks (on 06/24/2024) for [facility], ob visit.   Lola Donnice HERO, MD

## 2024-05-27 NOTE — Patient Instructions (Signed)
 Opciones de mtodos anticonceptivos Birth Control Options Los mtodos anticonceptivos tambin se denominan anticonceptivos. Los anticonceptivos previenen Firefighter. Hay muchos tipos de anticonceptivos. Trabaje con el mdico para encontrar la opcin ms adecuada para usted. Anticonceptivos que [place] hormonas Estos tipos de anticonceptivos contienen hormonas para Neurosurgeon. Implante anticonceptivo Este es un pequeo tubo que se coloca dentro de la piel del brazo. El tubo Insurance claims handler colocado durante 3 aos como mximo. Inyeccin anticonceptiva Son inyecciones que se aplican cada 3 meses. Pldoras anticonceptivas Esta es una pldora que se toma todos [place]. Debe tomarla a la [employer]. Parche anticonceptivo Este es un parche que se coloca sobre la piel. Se debe cambiar 1 vez por semana durante 3 semanas. Despus de [employer], el parche se debe retirar durante 1 semana. Anillo vaginal  Este es un anillo de plstico blando que se coloca dentro de la vagina. El anillo se deja colocado durante 3 semanas. Luego, se debe retirar durante 1 semana. Despus se coloca un nuevo anillo. Mtodos de barrera  Preservativo masculino Es una cubierta delgada que se coloca sobre el pene antes de [place]. El preservativo se desecha despus de Doctor, hospital. Preservativo femenino Es una cubierta blanda y suelta que se coloca en la vagina antes de [place]. El preservativo se desecha despus de Doctor, hospital. Diafragma El diafragma es una barrera blanda con forma de tazn. Debe estar hecho para adaptarse a su cuerpo. Se coloca en la vagina antes de tener sexo con una sustancia qumica que destruye los espermatozoides llamada espermicida. El Designer, fashion/clothing en la vagina durante 6 a 8 horas despus de tener sexo y debe retirarse en un plazo de 24 horas. El diafragma se debe reemplazar: Cada 1 o 2 aos. Despus de dar a luz. Despus de aumentar ms de 15 libras (6.8  kg). Si se somete a una ciruga en la pelvis. Capuchn cervical Este es un capuchn pequeo y [place] se fija sobre el cuello uterino. El cuello uterino es la parte ms baja del tero. Se coloca en la vagina antes del sexo, junto con un espermicida. El capuchn debe fabricarse para usted. El capuchn se debe dejar colocado durante 6 a 8 horas despus del sexo. Se debe retirar en un plazo de 48 horas. El capuchn cervical debe ser recetado y adaptado a su cuerpo por un mdico. Debe reemplazarse cada 2 aos. Esponja Esta es una esponja pequea que se coloca en la vagina antes de [place]. Se debe dejar colocada durante al menos 6 horas despus de [employer]. Se debe retirar en un plazo de 30 horas y desecharse. Espermicidas Son sustancias qumicas que destruyen o impiden que los espermatozoides ingresen al tero. Se pueden presentar en forma de pldora, crema, gel o espuma que se debe colocar en la vagina. Se deben usar al menos de 10 a 15 minutos antes de [employer]. Dispositivo intrauterino Un dispositivo intrauterino (DIU) es un dispositivo que un mdico coloca dentro del tero. Existen dos tipos: DIU hormonal. Este tipo puede permanecer colocado durante 3 a 5 aos. DIU de cobre. Este tipo Insurance claims handler colocado durante 10 aos. Mtodos anticonceptivos permanentes Ligadura de trompas en la mujer Es una ciruga para obstruir las trompas de [place]. Esterilizacin masculina Es una ciruga, llamada vasectoma, para ligar los conductos que transportan los espermatozoides en los hombres. Este mtodo funciona al cabo de 3 meses. Se deben usar otros mtodos anticonceptivos durante 3 meses. Mtodos de planificacin  natural Esto significa no tener [employer] la pareja femenina podra quedar embarazada. A continuacin se mencionan algunos mtodos anticonceptivos por planificacin natural: Usar un calendario a fin de: Hacer un seguimiento de la duracin de cada ciclo  menstrual. Determinar en [employer] se podra producir Firefighter. Planificar no tener [employer] en que se podra producir Firefighter. Reconocer los signos de la ovulacin y no tener relaciones sexuales durante ese perodo. La pareja femenina puede detectar cundo ser la ovulacin haciendo un seguimiento de su temperatura todos [place]. Tambin puede examinar si hay cambios en la mucosidad que proviene del cuello uterino. Dnde obtener ms informacin Centers for Disease Control and Prevention (Centros para el Control y la Prevencin de Enfermedades): [email or web address]. Luego: Introduzca "birth control" o "anticonceptivos" en el cuadro de bsqueda. Esta informacin no tiene Theme park manager el consejo del mdico. Asegrese de hacerle al mdico cualquier pregunta que tenga. Document Revised: 04/20/2023 Document Reviewed: 04/20/2023 Elsevier Patient Education  2024 [employer].

## 2024-05-28 ENCOUNTER — Other Ambulatory Visit: Payer: Self-pay | Admitting: *Deleted

## 2024-05-28 ENCOUNTER — Other Ambulatory Visit

## 2024-05-28 ENCOUNTER — Ambulatory Visit: Attending: Obstetrics and Gynecology | Admitting: Obstetrics

## 2024-05-28 ENCOUNTER — Ambulatory Visit

## 2024-05-28 VITALS — BP 111/62 | HR 67

## 2024-05-28 DIAGNOSIS — Z3A39 39 weeks gestation of pregnancy: Secondary | ICD-10-CM | POA: Diagnosis not present

## 2024-05-28 DIAGNOSIS — E669 Obesity, unspecified: Secondary | ICD-10-CM | POA: Diagnosis not present

## 2024-05-28 DIAGNOSIS — O09523 Supervision of elderly multigravida, third trimester: Secondary | ICD-10-CM | POA: Insufficient documentation

## 2024-05-28 DIAGNOSIS — O09522 Supervision of elderly multigravida, second trimester: Secondary | ICD-10-CM

## 2024-05-28 DIAGNOSIS — Z8759 Personal history of other complications of pregnancy, childbirth and the puerperium: Secondary | ICD-10-CM

## 2024-05-28 DIAGNOSIS — O09292 Supervision of pregnancy with other poor reproductive or obstetric history, second trimester: Secondary | ICD-10-CM

## 2024-05-28 DIAGNOSIS — O99213 Obesity complicating pregnancy, third trimester: Secondary | ICD-10-CM | POA: Diagnosis not present

## 2024-05-28 DIAGNOSIS — Z3A17 17 weeks gestation of pregnancy: Secondary | ICD-10-CM

## 2024-05-28 DIAGNOSIS — O09293 Supervision of pregnancy with other poor reproductive or obstetric history, third trimester: Secondary | ICD-10-CM | POA: Insufficient documentation

## 2024-05-28 DIAGNOSIS — Z315 Encounter for genetic counseling: Secondary | ICD-10-CM | POA: Insufficient documentation

## 2024-05-28 DIAGNOSIS — O09299 Supervision of pregnancy with other poor reproductive or obstetric history, unspecified trimester: Secondary | ICD-10-CM

## 2024-05-28 DIAGNOSIS — Z3A19 19 weeks gestation of pregnancy: Secondary | ICD-10-CM | POA: Diagnosis not present

## 2024-05-28 DIAGNOSIS — O0942 Supervision of pregnancy with grand multiparity, second trimester: Secondary | ICD-10-CM

## 2024-05-28 DIAGNOSIS — O99212 Obesity complicating pregnancy, second trimester: Secondary | ICD-10-CM

## 2024-05-28 DIAGNOSIS — Z362 Encounter for other antenatal screening follow-up: Secondary | ICD-10-CM

## 2024-05-28 DIAGNOSIS — Z36 Encounter for antenatal screening for chromosomal anomalies: Secondary | ICD-10-CM

## 2024-05-28 DIAGNOSIS — O26842 Uterine size-date discrepancy, second trimester: Secondary | ICD-10-CM

## 2024-05-28 NOTE — Progress Notes (Signed)
 [facility] at [facility] [address] Phone:  [phone]   Fax:  [phone]      In-Person Genetic Counseling Clinic Note:   I spoke with 43 y.o. Nicole Little today to discuss her age-related pregnancy risks. She was referred by Nicole Maiden, NP. An in-person Spanish interpreter was present.   Pregnancy History:    [id number]. EGA: [redacted]w[redacted]d by US . EDD: 11/01/2024. Nicole Little has three healthy children. She had one recent SAB of unknown etiology. She had a stillborn female at 36w gestation in 2004, no known cause. Denies major personal health concerns. Denies bleeding, infections, and fevers in this pregnancy. Denies using tobacco, alcohol, or street drugs in this pregnancy.   Family History:    A three-generation pedigree was created and scanned into Epic under the Media tab.  We reviewed the recurrence risk of a stillbirth is limited without additional information. However, she will be monitored throughout this pregnancy.  Patient reports a full brother and maternal half-brother who each has a daughter with Down syndrome. No other information is known. We discussed that most cases of Down syndrome occur by chance. However, without medical and genetic testing reports, familial causes to Down syndrome or the possibility of another genetic condition cannot be entirely ruled out. We reviewed that it is unusual for two individuals in the family to have Down syndrome. Patient was reassured that her NIPS was low-risk for Down syndrome. If she learns of additional information regarding her family member's diagnoses, we are happy to revisit and discuss further. Moreover, we offered additional chromosome analysis for the patient due to this family history and her personal history of SAB and stillbirth; she declined at this time.  Patient ethnicity reported as Hispanic and FOB ethnicity reported as Hispanic. Denies Ashkenazi Jewish  ancestry.  Family history not remarkable for consanguinity, individuals with birth defects, intellectual disability, autism spectrum disorder, multiple spontaneous abortions, still births, or unexplained neonatal death.   Advanced Maternal Age:  We briefly discussed that the chance that a fetus would be affected with a chromosome difference increases with advanced maternal age. Nicole Little's current age-related risk to have a pregnancy affected with a chromosome difference is approximately 1 in 39 (~2.6%). We discussed that this risk may also be lower given her low-risk NIPS results and normal anatomy ultrasound.  We discussed and offered the option of amniocentesis. The technical aspects, benefits, risks, and limitations were reviewed with the patient including the 1 in 500 risk for miscarriage. We also discussed that testing can be performed postnatally if there is concern for a chromosomal condition in the newborn. Nicole Little declined amniocentesis.   Newborn Screening. The [facility]  Newborn Screening (NBS) program will screen all newborn babies for cystic fibrosis, spinal muscular atrophy, hemoglobinopathies, and numerous other conditions.  Previous Testing Completed:  Low risk NIPS: Nicole Little previously completed [facility] noninvasive prenatal screening (NIPS) in this pregnancy. The result is low risk, consistent with a female fetus. This screening significantly reduces but does not eliminate the chance that the current pregnancy has Down syndrome (trisomy 45), trisomy 68, trisomy 78, common sex chromosome conditions, and 22q11.2 microdeletion syndrome. Please see report for details. There are many genetic conditions that cannot be detected by NIPS.   Negative carrier screening: Nicole Little previously completed [facility] carrier screening. She screened to not be a carrier for cystic fibrosis (CF), spinal muscular atrophy (SMA), alpha thalassemia, and beta hemoglobinopathies. Please see report for details. A  negative  result on carrier screening reduces but does not eliminate the chance of being a carrier.    Plan of Care:   Declined amniocentesis. Follow-up MFC ultrasound on 06/25/2024.   Informed consent was obtained. All questions were answered.   45 minutes were spent on the date of the encounter in service to the patient including preparation, face-to-face consultation, discussion of test reports and available next steps, pedigree construction, genetic risk assessment, documentation, and care coordination.    Thank you for sharing in the care of Nicole Little with us .  Please do not hesitate to contact us  at [phone] if you have any questions.   Lauraine Bodily, MS, [facility] Certified Genetic Counselor   Genetic counseling student involved in appointment: No.

## 2024-05-28 NOTE — Progress Notes (Signed)
 MFM Consult Note  Nicole Little was seen due to advanced maternal age (43 years old) and grand multiparity.    The patient reports that during her first pregnancy in 2004 (delivered here in [place]), an IUFD occurred at 36+ weeks.  The cause of the IUFD remains undetermined.  She denies any other significant past medical history and denies any problems in her current pregnancy.    She had a cell free DNA test earlier in her pregnancy which indicated a low risk for trisomy 41, 37, and 13. A female fetus is predicted.   Sonographic findings Single intrauterine pregnancy at 17w 4d  Fetal cardiac activity:  Observed and appears normal. Presentation: Cephalic. The anatomic structures that were well seen appear normal without evidence of soft markers. Due to poor acoustic windows some structures remain suboptimally visualized. Amniotic fluid: Within normal limits.  MVP: 5.23 cm. Placenta: Anterior. Adnexa: No abnormality visualized. Cervical length: 4.1 cm.  Based on the fetal biometry measurements obtained today, her EDC was changed to November 01, 2024, making her 17 weeks and 4 days pregnant today.  This is the first ultrasound that she has had in her current pregnancy.  The views of the fetal anatomy were limited today due to the smaller fetal size.  However, what was visualized today appeared within normal limits.  The patient was informed that anomalies may be missed due to technical limitations. If the fetus is in a suboptimal position or maternal habitus is increased, visualization of the fetus in the maternal uterus may be impaired.  Advanced maternal age The increased risk of fetal aneuploidy due to advanced maternal age was discussed.  Due to advanced maternal age, the patient was offered and declined an amniocentesis today for definitive diagnosis of fetal aneuploidy.  She is comfortable with the low risk indicated by her cell free DNA test. Due to advanced maternal age, we  will continue to follow her with growth ultrasounds throughout her pregnancy.   The increased risk of preeclampsia and gestational diabetes associated with advanced maternal age was discussed today.   She was advised to continue taking a daily baby aspirin for preeclampsia prophylaxis.  Prior IUFD at 36+ weeks The patient met with our genetic counselor following today's ultrasound exam to discuss the significance of her prior IUFD. Due to advanced maternal age and prior IUFD, we will start weekly fetal testing at 32 weeks. Delivery should occur by 39 weeks.   Delivery at between 37 to 38 weeks may be considered should she have extreme anxiety regarding another IUFD.  A follow-up exam was scheduled in 4 weeks to confirm her dates and to complete the views of the fetal anatomy.    The patient stated that all of her questions were answered today.    All conversations were held with the patient today with the help of a Spanish interpreter.  A total of 45 minutes was spent counseling and coordinating the care for this patient.  Greater than 50% of the time was spent in direct face-to-face contact.

## 2024-05-29 ENCOUNTER — Telehealth: Payer: Self-pay | Admitting: *Deleted

## 2024-05-29 NOTE — Telephone Encounter (Signed)
 Requested Interpreter Eda Royal call patient to offer [facility] prenatal care Spanish group. Unable to reach patient. Rock Skip PEAK

## 2024-05-30 ENCOUNTER — Ambulatory Visit: Payer: Self-pay | Admitting: Family Medicine

## 2024-05-30 DIAGNOSIS — O0990 Supervision of high risk pregnancy, unspecified, unspecified trimester: Secondary | ICD-10-CM

## 2024-05-30 LAB — AFP, SERUM, OPEN SPINA BIFIDA
AFP MoM: 1.28
AFP Value: 58.1 ng/mL
Gest. Age on Collection Date: 18.9 wk
Maternal Age At EDD: 42.3 a
OSBR Risk 1 IN: 5096
Test Results:: NEGATIVE
Weight: 165 [lb_av]

## 2024-06-04 ENCOUNTER — Other Ambulatory Visit: Payer: Self-pay

## 2024-06-04 DIAGNOSIS — O0990 Supervision of high risk pregnancy, unspecified, unspecified trimester: Secondary | ICD-10-CM

## 2024-06-04 MED ORDER — MECLIZINE HCL 12.5 MG PO TABS: 12.5000 mg | ORAL_TABLET | Freq: Three times a day (TID) | ORAL | 0 refills | Status: DC | PRN

## 2024-06-04 NOTE — Telephone Encounter (Signed)
 Pt request refill on her meclinzine.  Dr. Eldonna agreed to refill.    Desmin Daleo,RN  06/04/24

## 2024-06-25 ENCOUNTER — Ambulatory Visit (HOSPITAL_BASED_OUTPATIENT_CLINIC_OR_DEPARTMENT_OTHER)

## 2024-06-25 ENCOUNTER — Ambulatory Visit: Attending: Obstetrics | Admitting: Maternal & Fetal Medicine

## 2024-06-25 ENCOUNTER — Other Ambulatory Visit: Payer: Self-pay | Admitting: *Deleted

## 2024-06-25 VITALS — BP 126/63 | HR 81

## 2024-06-25 DIAGNOSIS — Z8759 Personal history of other complications of pregnancy, childbirth and the puerperium: Secondary | ICD-10-CM

## 2024-06-25 DIAGNOSIS — Z362 Encounter for other antenatal screening follow-up: Secondary | ICD-10-CM

## 2024-06-25 DIAGNOSIS — Z3A21 21 weeks gestation of pregnancy: Secondary | ICD-10-CM | POA: Insufficient documentation

## 2024-06-25 DIAGNOSIS — O26842 Uterine size-date discrepancy, second trimester: Secondary | ICD-10-CM | POA: Insufficient documentation

## 2024-06-25 DIAGNOSIS — O09292 Supervision of pregnancy with other poor reproductive or obstetric history, second trimester: Secondary | ICD-10-CM | POA: Diagnosis not present

## 2024-06-25 DIAGNOSIS — O0990 Supervision of high risk pregnancy, unspecified, unspecified trimester: Secondary | ICD-10-CM | POA: Diagnosis not present

## 2024-06-25 DIAGNOSIS — O09522 Supervision of elderly multigravida, second trimester: Secondary | ICD-10-CM

## 2024-06-25 DIAGNOSIS — O99212 Obesity complicating pregnancy, second trimester: Secondary | ICD-10-CM | POA: Diagnosis not present

## 2024-06-25 DIAGNOSIS — E669 Obesity, unspecified: Secondary | ICD-10-CM

## 2024-06-25 DIAGNOSIS — O09299 Supervision of pregnancy with other poor reproductive or obstetric history, unspecified trimester: Secondary | ICD-10-CM

## 2024-06-25 NOTE — Progress Notes (Signed)
 Patient information  Patient Name: Nicole Little  Patient MRN:   [id number]  Referring practice: MFM Referring Provider: [facility])  Problem List   Patient Active Problem List   Diagnosis Date Noted   Unwanted fertility 05/27/2024   Supervision of high risk pregnancy, antepartum 04/29/2024   AMA (advanced maternal age) multigravida 35+ 07/10/2023   Language barrier 11/02/2016   History of IUFD 10/05/2016   Maternal Fetal Medicine Consult Nicole Little is a 43 y.o. H3E6886 at [redacted]w[redacted]d here for ultrasound and consultation. She had low risk aneuploidy screening of a female fetus. Carrier screening was Negative for the basic screening (SMA, alpha-thal, beta-thal, and cystic fibroisis in a previous pregnancy.  Maternal serum AFP was negative. She has no acute concerns.   Today we focused on the following:   History of IUFD: This occurred at 36 weeks and her first pregnancy in 2004.  She is uncertain of the workup done at the time.  I have not seen evidence of antiphospholipid labs.  If not previously assessed this should be ordered by the [facility] provider to assess for an acquired thrombophilia.  If these are negative then the patient does not have antiphospholipid syndrome.  Since she has had 3 normal term deliveries since that time my concern is very low but it should be ordered if not previously done.  I discussed the utility of growth ultrasounds and antenatal testing later in the pregnancy as well.  Delivery timing can occur between 37 and 39 weeks depend on  the level of maternal anxiety.  AMA: Normal NIPT.  There is a small risk of preeclampsia, growth restriction and need for early delivery in pregnancies over the age of 28.  She will already have growth ultrasounds and antenatal testing scheduled based on her history of an IUFD.   Sonographic findings Single intrauterine pregnancy at 21w 4d. Fetal cardiac activity:  Observed and appears  normal. Presentation: Transverse, head to maternal right. The anatomic structures that were well seen appear normal without evidence of soft markers. The anatomic survey is complete.  Fetal biometry shows the estimated fetal weight at the 48 percentile. Amniotic fluid: Within normal limits.  MVP: 5.62 cm. Placenta: Anterior. Adnexa: No adnexal mass visualized. Cervical length: 3.1 cm.  There are limitations of prenatal ultrasound such as the inability to detect certain abnormalities due to poor visualization. Various factors such as fetal position, gestational age and maternal body habitus may increase the difficulty in visualizing the fetal anatomy.    Recommendations -EDD should be 11/01/2024 based on  U/S  (05/28/24).  - Anatomy ultrasound was done today with the above findings (see report). - Aspirin 81-162 mg continued throughout the pregnancy for preeclampsia prophylaxis. - Baseline labs: CMP, CBC, urine protein creatinine ratio if not previously completed. Repeat with any concerns for preeclampsia. - Blood pressure goal of < 140 systolic and < 90 diastolic. Antihypertensive medication should be added/adjusted until BP goal is achieved.  - If not previously done antiphospholipid labs should be ordered due to the patient's history of intrauterine fetal demise - Serial growth ultrasounds every 4 weeks starting at 28 weeks until delivery. - Antenatal testing (usually weekly BPP or NST) weekly at 32 weeks until delivery. - Delivery likely around 37-[redacted] weeks gestation or sooner if indicated.  An in person interpreter was used in the patient's language of preference for today's visit.  Review of Systems: A review of systems was performed and  was negative except per HPI   Past Obstetrical History:  OB History  Gravida Para Term Preterm AB Living  6 4 3 1 1 3   SAB IAB Ectopic Multiple Live Births  1 0 0 0 3    # Outcome Date GA Lbr Len/2nd Weight Sex Type Anes PTL Lv  6 Current            5 SAB 06/2023 [redacted]w[redacted]d    SAB     4 Term 03/14/17 [redacted]w[redacted]d 06:58 / 00:07 6 lb 12.8 oz (3.084 kg) F Vag-Spont None  LIV  3 Term 05/08/06 [redacted]w[redacted]d  6 lb 7 oz (2.92 kg) F Vag-Spont   LIV  2 Term 06/29/04 [redacted]w[redacted]d  6 lb 10 oz (3.005 kg) M Vag-Spont   LIV  1 Preterm 06/14/03 [redacted]w[redacted]d  4 lb (1.814 kg)  Vag-Spont   FD    Obstetric Comments  36 week IUFD in 2004     Past Medical History:  Past Medical History:  Diagnosis Date   Depression    Headache      Past Surgical History:    Past Surgical History:  Procedure Laterality Date   BREAST BIOPSY Right 10/04/2022   US  RT BREAST BX W LOC DEV 1ST LESION IMG BX SPEC US  GUIDE 10/04/2022 [facility]   BREAST BIOPSY Left 10/04/2022   US  LT BREAST BX W LOC DEV 1ST LESION IMG BX SPEC US  GUIDE 10/04/2022 [facility]     Home Medications:   Current Outpatient Medications on File Prior to Visit  Medication Sig Dispense Refill   aspirin EC 81 MG tablet Take 81 mg by mouth daily. Swallow whole.     ferrous sulfate 325 (65 FE) MG tablet Take 325 mg by mouth daily with breakfast.     meclizine  (ANTIVERT ) 12.5 MG tablet Take 1 tablet (12.5 mg total) by mouth 3 (three) times daily as needed for dizziness. 30 tablet 0   OVER THE COUNTER MEDICATION as needed. Aspirin. Pt does not know the dose.     Prenatal Vit-Fe Fumarate-FA (MULTIVITAMIN-PRENATAL) 27-0.8 MG TABS tablet Take 1 tablet by mouth daily.     Simethicone  80 MG TABS Take 1 tablet (80 mg total) by mouth 3 (three) times daily as needed (gas discomfort). 30 tablet 0   No current facility-administered medications on file prior to visit.      Allergies:   Allergies  Allergen Reactions   Penicillins Other (See Comments), Rash and Dermatitis    Has patient had a PCN reaction causing immediate rash, facial/tongue/throat swelling, NO,SOB or lightheadedness with hypotension: No  Has patient had a PCN reaction causing severe rash involving mucus membranes or skin necrosis: No  Has patient had a PCN  reaction that required hospitalization: No  Has patient had a PCN reaction occurring within the last 10 years: No  If all of the above answers are NO, then may proceed with Cephalosporin use.  Rash and itching on skin only- with first preg 2004  Has patient had a PCN reaction causing immediate rash, facial/tongue/throat swelling, NO,SOB or lightheadedness with hypotension: No Has patient had a PCN reaction causing severe rash involving mucus membranes or skin necrosis: No Has patient had a PCN reaction that required hospitalization: No Has patient had a PCN reaction occurring within the last 10 years: No If all of the above answers are NO, then may proceed with Cephalosporin use. Rash and itching on skin only- with first preg 2004     Physical Exam:  Vitals:   06/25/24 1140  BP: 126/63  Pulse: 81   Sitting comfortably on the sonogram table Nonlabored breathing Normal rate and rhythm Abdomen is nontender  Thank you for the opportunity to be involved with this patient's care. Please let us  know if we can be of any further assistance.   30 minutes of time was spent reviewing the patient's chart including labs, imaging and documentation.  At least 50% of this time was spent with direct patient care discussing the diagnosis, management and prognosis of her care.  Delora Smaller MFM, [facility]   06/25/2024  12:38 PM

## 2024-06-30 ENCOUNTER — Other Ambulatory Visit: Payer: Self-pay

## 2024-06-30 ENCOUNTER — Ambulatory Visit: Admitting: Obstetrics and Gynecology

## 2024-06-30 ENCOUNTER — Other Ambulatory Visit: Payer: Self-pay | Admitting: Obstetrics and Gynecology

## 2024-06-30 VITALS — BP 124/77 | HR 92 | Wt 172.1 lb

## 2024-06-30 DIAGNOSIS — Z758 Other problems related to medical facilities and other health care: Secondary | ICD-10-CM

## 2024-06-30 DIAGNOSIS — O0992 Supervision of high risk pregnancy, unspecified, second trimester: Secondary | ICD-10-CM | POA: Diagnosis not present

## 2024-06-30 DIAGNOSIS — O09522 Supervision of elderly multigravida, second trimester: Secondary | ICD-10-CM

## 2024-06-30 DIAGNOSIS — Z603 Acculturation difficulty: Secondary | ICD-10-CM

## 2024-06-30 DIAGNOSIS — Z8759 Personal history of other complications of pregnancy, childbirth and the puerperium: Secondary | ICD-10-CM | POA: Diagnosis not present

## 2024-06-30 DIAGNOSIS — O26892 Other specified pregnancy related conditions, second trimester: Secondary | ICD-10-CM

## 2024-06-30 DIAGNOSIS — Z3A22 22 weeks gestation of pregnancy: Secondary | ICD-10-CM

## 2024-06-30 DIAGNOSIS — R12 Heartburn: Secondary | ICD-10-CM

## 2024-06-30 DIAGNOSIS — O0990 Supervision of high risk pregnancy, unspecified, unspecified trimester: Secondary | ICD-10-CM

## 2024-06-30 MED ORDER — PANTOPRAZOLE SODIUM 40 MG PO TBEC
40.0000 mg | DELAYED_RELEASE_TABLET | Freq: Every day | ORAL | 1 refills | Status: AC
Start: 2024-06-30 — End: ?

## 2024-06-30 NOTE — Patient Instructions (Signed)

## 2024-06-30 NOTE — Progress Notes (Signed)
   PRENATAL VISIT NOTE  Subjective:  Nicole Little is a 43 y.o. H3E6886 at [redacted]w[redacted]d being seen today for ongoing prenatal care.  She is currently monitored for the following issues for this high-risk pregnancy and has History of IUFD; Language barrier; AMA (advanced maternal age) multigravida 35+; Supervision of high risk pregnancy, antepartum; and Unwanted fertility on their problem list.  Patient doing well with no acute concerns today. She reports heartburn.   . Vag. Bleeding: None.  Movement: Present. Denies leaking of fluid.   The following portions of the patient's history were reviewed and updated as appropriate: allergies, current medications, past family history, past medical history, past social history, past surgical history and problem list. Problem list updated.  Objective:   Vitals:   06/30/24 1101  BP: 124/77  Pulse: 92  Weight: 172 lb 1.6 oz (78.1 kg)    Fetal Status: Fetal Heart Rate (bpm): 147 Fundal Height: 23 cm Movement: Present     General:  Alert, oriented and cooperative. Patient is in no acute distress.  Skin: Skin is warm and dry. No rash noted.   Cardiovascular: Normal heart rate noted  Respiratory: Normal respiratory effort, no problems with respiration noted  Abdomen: Soft, gravid, appropriate for gestational age.  Pain/Pressure: Present     Pelvic: Cervical exam deferred        Extremities: Normal range of motion.     Mental Status:  Normal mood and affect. Normal behavior. Normal judgment and thought content.   Assessment and Plan:  Pregnancy: H3E6886 at [redacted]w[redacted]d  1. Supervision of high risk pregnancy, antepartum (Primary) Continue routine prenatal care  2. [redacted] weeks gestation of pregnancy   3. Language barrier Live interpreter present  4. History of IUFD Serial testing starting at 32 weeks per MFM  5. Multigravida of advanced maternal age in second trimester   6. Heartburn during pregnancy in second trimester Rx for protonix sent -  pantoprazole (PROTONIX) 40 MG tablet; Take 1 tablet (40 mg total) by mouth daily.  Dispense: 30 tablet; Refill: 1  Preterm labor symptoms and general obstetric precautions including but not limited to vaginal bleeding, contractions, leaking of fluid and fetal movement were reviewed in detail with the patient.  Please refer to After Visit Summary for other counseling recommendations.   Return in about 4 weeks (around 07/28/2024) for ROB, in person, 2 hr GTT, 3rd trim labs.   Jerilynn Buddle, MD Faculty Attending Center for [facility]

## 2024-07-29 NOTE — Progress Notes (Unsigned)
 PRENATAL VISIT NOTE  Subjective:  Nicole Little is a 43 y.o. [phone] at [redacted]w[redacted]d being seen today for ongoing prenatal care.  She is currently monitored for the following issues for this high-risk pregnancy and has History of IUFD; Language barrier; AMA (advanced maternal age) multigravida 35+; Supervision of high risk pregnancy, antepartum; and Unwanted fertility on their problem list.  Patient reports {[id number]}.   .  .   . Denies leaking of fluid.   The following portions of the patient's history were reviewed and updated as appropriate: allergies, current medications, past family history, past medical history, past social history, past surgical history and problem list.   Objective:   There were no vitals filed for this visit.  Fetal Status:           General: Alert, oriented and cooperative. Patient is in no acute distress.  Skin: Skin is warm and dry. No rash noted.   Cardiovascular: Normal heart rate noted  Respiratory: Normal respiratory effort, no problems with respiration noted  Abdomen: Soft, gravid, appropriate for gestational age.        Pelvic: Cervical exam deferred        Extremities: Normal range of motion.     Mental Status: Normal mood and affect. Normal behavior. Normal judgment and thought content.      04/29/2024    5:24 PM 03/12/2017   12:45 PM 03/08/2017    1:45 PM  Depression screen PHQ 2/9  Decreased Interest 1 0 0  Down, Depressed, Hopeless 0 0 0  PHQ - 2 Score 1 0 0  Altered sleeping 0 0 0  Tired, decreased energy 3 0 0  Change in appetite 0 0 0  Feeling bad or failure about yourself  0 0 0  Trouble concentrating 0 0 0  Moving slowly or fidgety/restless 0 0 0  Suicidal thoughts 0 0 0  PHQ-9 Score 4  0  0      Data saved with a previous flowsheet row definition        04/29/2024    5:25 PM 03/12/2017   12:45 PM 03/08/2017    1:45 PM 03/01/2017   12:45 PM  GAD 7 : Generalized Anxiety Score  Nervous, Anxious, on Edge 0 0 0 0  Control/stop  worrying 0 0 0 0  Worry too much - different things 2 0 0 0  Trouble relaxing 0 0 0 0  Restless 0 0 0 0  Easily annoyed or irritable 0 0 0 0  Afraid - awful might happen 2 0 0 0  Total GAD 7 Score 4 0 0 0    Assessment and Plan:  Pregnancy: [id number] at [redacted]w[redacted]d 1. Supervision of high risk pregnancy, antepartum (Primary) 28 wk labs today.  Offered and recommend tdap and flu shot today. Pt ***.   2. Pregnancy with 26 completed weeks gestation  3. Unwanted fertility Has BCBS. No state tubal papers required.   4. Language barrier Interpreter used throughout.  5. History of IUFD Antenatal testing at 32w Continue serial growth with MFM - next growth is 12/4.   6. Antepartum multigravida of advanced maternal age LR NIPS  Preterm labor symptoms and general obstetric precautions including but not limited to vaginal bleeding, contractions, leaking of fluid and fetal movement were reviewed in detail with the patient. Please refer to After Visit Summary for other counseling recommendations.   No follow-ups on file.  Future Appointments  Date Time Provider Department Center  07/30/2024  8:55  AM Cleatus Moccasin, MD [facility]  07/30/2024  9:20 AM [facility] LAB [facility]  07/31/2024  7:15 AM [facility] PROVIDER 1 [facility]  07/31/2024  7:30 AM [facility] US5 [facility] [place]  08/13/2024  8:15 AM Zina Jerilynn LABOR, MD [facility]  08/29/2024  8:15 AM [facility]-GENERAL 1 [facility]  09/10/2024  8:55 AM Fredirick Glenys RAMAN, MD [facility]  09/23/2024  1:15 PM Milly Olam LABOR, CNM [facility]  10/01/2024 11:15 AM Fredirick Glenys RAMAN, MD [facility]  10/08/2024  8:55 AM [facility]-GENERAL 1 [facility]  10/15/2024 10:55 AM [facility]-GENERAL 1 [facility]  10/22/2024 10:55 AM [facility]-GENERAL 1 [facility]    Moccasin Cleatus, MD

## 2024-07-30 ENCOUNTER — Other Ambulatory Visit

## 2024-07-30 ENCOUNTER — Other Ambulatory Visit: Payer: Self-pay

## 2024-07-30 ENCOUNTER — Encounter: Payer: Self-pay | Admitting: Obstetrics and Gynecology

## 2024-07-30 ENCOUNTER — Ambulatory Visit: Admitting: Obstetrics and Gynecology

## 2024-07-30 VITALS — BP 118/64 | HR 89 | Wt 175.8 lb

## 2024-07-30 DIAGNOSIS — O0992 Supervision of high risk pregnancy, unspecified, second trimester: Secondary | ICD-10-CM

## 2024-07-30 DIAGNOSIS — O09522 Supervision of elderly multigravida, second trimester: Secondary | ICD-10-CM | POA: Diagnosis not present

## 2024-07-30 DIAGNOSIS — Z603 Acculturation difficulty: Secondary | ICD-10-CM

## 2024-07-30 DIAGNOSIS — O0990 Supervision of high risk pregnancy, unspecified, unspecified trimester: Secondary | ICD-10-CM

## 2024-07-30 DIAGNOSIS — Z3009 Encounter for other general counseling and advice on contraception: Secondary | ICD-10-CM | POA: Diagnosis not present

## 2024-07-30 DIAGNOSIS — Z8759 Personal history of other complications of pregnancy, childbirth and the puerperium: Secondary | ICD-10-CM | POA: Diagnosis not present

## 2024-07-30 DIAGNOSIS — Z23 Encounter for immunization: Secondary | ICD-10-CM | POA: Diagnosis not present

## 2024-07-30 DIAGNOSIS — Z758 Other problems related to medical facilities and other health care: Secondary | ICD-10-CM | POA: Diagnosis not present

## 2024-07-30 DIAGNOSIS — O09529 Supervision of elderly multigravida, unspecified trimester: Secondary | ICD-10-CM

## 2024-07-30 DIAGNOSIS — Z3A26 26 weeks gestation of pregnancy: Secondary | ICD-10-CM | POA: Diagnosis not present

## 2024-07-30 MED ORDER — ASPIRIN 81 MG PO TBEC: 81.0000 mg | DELAYED_RELEASE_TABLET | Freq: Every day | ORAL | 3 refills | Status: AC

## 2024-07-30 MED ORDER — PRENATAL 27-0.8 MG PO TABS: 1.0000 | ORAL_TABLET | Freq: Every day | ORAL | 1 refills | Status: AC

## 2024-07-30 MED ORDER — MECLIZINE HCL 12.5 MG PO TABS: 12.5000 mg | ORAL_TABLET | Freq: Three times a day (TID) | ORAL | 0 refills | Status: AC | PRN

## 2024-07-30 NOTE — Addendum Note (Signed)
 Addended byBETHA WENCESLAO BROWNING on: 07/30/2024 09:42 AM   Modules accepted: Orders

## 2024-07-31 ENCOUNTER — Ambulatory Visit: Attending: Obstetrics and Gynecology | Admitting: Obstetrics

## 2024-07-31 ENCOUNTER — Ambulatory Visit

## 2024-07-31 ENCOUNTER — Ambulatory Visit: Payer: Self-pay | Admitting: Obstetrics and Gynecology

## 2024-07-31 ENCOUNTER — Other Ambulatory Visit: Payer: Self-pay | Admitting: *Deleted

## 2024-07-31 VITALS — BP 120/55 | HR 71

## 2024-07-31 DIAGNOSIS — O09522 Supervision of elderly multigravida, second trimester: Secondary | ICD-10-CM

## 2024-07-31 DIAGNOSIS — Z3A26 26 weeks gestation of pregnancy: Secondary | ICD-10-CM

## 2024-07-31 DIAGNOSIS — Z8759 Personal history of other complications of pregnancy, childbirth and the puerperium: Secondary | ICD-10-CM

## 2024-07-31 DIAGNOSIS — O24410 Gestational diabetes mellitus in pregnancy, diet controlled: Secondary | ICD-10-CM

## 2024-07-31 LAB — GLUCOSE TOLERANCE, 2 HOURS W/ 1HR
Glucose, 1 hour: 166 mg/dL (ref 70–179)
Glucose, 2 hour: 172 mg/dL — ABNORMAL HIGH (ref 70–152)
Glucose, Fasting: 100 mg/dL — ABNORMAL HIGH (ref 70–91)

## 2024-07-31 LAB — CBC
Hematocrit: 34.5 % (ref 34.0–46.6)
Hemoglobin: 11.1 g/dL (ref 11.1–15.9)
MCH: 29.1 pg (ref 26.6–33.0)
MCHC: 32.2 g/dL (ref 31.5–35.7)
MCV: 91 fL (ref 79–97)
Platelets: 226 x10E3/uL (ref 150–450)
RBC: 3.81 x10E6/uL (ref 3.77–5.28)
RDW: 12.5 % (ref 11.7–15.4)
WBC: 11.3 x10E3/uL — ABNORMAL HIGH (ref 3.4–10.8)

## 2024-07-31 LAB — SYPHILIS: RPR W/REFLEX TO RPR TITER AND TREPONEMAL ANTIBODIES, TRADITIONAL SCREENING AND DIAGNOSIS ALGORITHM: RPR Ser Ql: NONREACTIVE

## 2024-07-31 LAB — HIV ANTIBODY (ROUTINE TESTING W REFLEX): HIV Screen 4th Generation wRfx: NONREACTIVE

## 2024-07-31 NOTE — Telephone Encounter (Signed)
-----   Message from Jerilynn DELENA Buddle sent at 07/31/2024  3:27 PM EST ----- Failed 2 hour GTT, refer to diabetic teaching, third trimester labs  normal ----- Message ----- From: Rebecka Memos Lab Results In Sent: 07/31/2024   8:41 AM EST To: Jerilynn DELENA Buddle, MD

## 2024-07-31 NOTE — Progress Notes (Signed)
 MFM Consult Note  Nicole Little is currently at [redacted]w[redacted]d. She has been followed due to advanced maternal age (43 years old) and history of IUFD at 36+ weeks.  She has screened positive for gestational diabetes in her current pregnancy. The results just came back this morning.  She has not received diabetic teaching yet.  Sonographic findings Single intrauterine pregnancy at 26w 5d.  Fetal cardiac activity:  Observed and appears normal. Presentation: Cephalic. Fetal biometry shows the estimated fetal weight of 2 lb 4 oz,  1010g (49%). Amniotic fluid volume: Within normal limits. MVP: 4.45 cm. Placenta: Anterior.  Due to advanced maternal age and her history of prior IUFD, we will start weekly fetal testing at 32 weeks.  She will return in 5 weeks for growth scan and BPP.  She should receive diabetic teaching soon.    The patient stated that all of her questions were answered.   All conversations were held with the patient today with the help of a Spanish interpreter.  A total of 10 minutes was spent counseling and coordinating the care for this patient.  Greater than 50% of the time was spent in direct face-to-face contact.

## 2024-08-01 MED ORDER — FREESTYLE FREEDOM LITE W/DEVICE KIT: 1.0000 | PACK | Freq: Once | 0 refills | Status: AC

## 2024-08-01 MED ORDER — GLUCOSE BLOOD VI STRP: ORAL_STRIP | 12 refills | Status: AC

## 2024-08-01 MED ORDER — FREESTYLE LANCETS MISC: 12 refills | Status: AC

## 2024-08-01 NOTE — Telephone Encounter (Addendum)
 Called pt with [employer] [phone].  Advised pt of GDM result and other normal labs.  CBG supplies sent to pt pharmacy and Diabetes Education visit scheduled for 08/05/24 at 09:15am. Encouraged pt to bring supplies to her appointment. Pt verbalized understanding and had no further questions at this time.   [place], RN      ----- Message from Jerilynn DELENA Buddle sent at 07/31/2024  3:27 PM EST ----- Failed 2 hour GTT, refer to diabetic teaching, third trimester labs  normal ----- Message ----- From: Rebecka Memos Lab Results In Sent: 07/31/2024   8:41 AM EST To: Jerilynn DELENA Buddle, MD

## 2024-08-01 NOTE — Telephone Encounter (Deleted)
-----   Message from Jerilynn DELENA Buddle sent at 07/31/2024  3:27 PM EST ----- Failed 2 hour GTT, refer to diabetic teaching, third trimester labs  normal ----- Message ----- From: Rebecka Memos Lab Results In Sent: 07/31/2024   8:41 AM EST To: Jerilynn DELENA Buddle, MD

## 2024-08-05 ENCOUNTER — Other Ambulatory Visit

## 2024-08-07 ENCOUNTER — Other Ambulatory Visit: Payer: Self-pay

## 2024-08-07 ENCOUNTER — Encounter: Attending: Obstetrics and Gynecology | Admitting: Dietician

## 2024-08-07 ENCOUNTER — Ambulatory Visit: Admitting: Dietician

## 2024-08-07 DIAGNOSIS — O24410 Gestational diabetes mellitus in pregnancy, diet controlled: Secondary | ICD-10-CM

## 2024-08-07 DIAGNOSIS — Z713 Dietary counseling and surveillance: Secondary | ICD-10-CM | POA: Diagnosis not present

## 2024-08-07 DIAGNOSIS — Z3A27 27 weeks gestation of pregnancy: Secondary | ICD-10-CM

## 2024-08-07 MED ORDER — FREESTYLE LITE W/DEVICE KIT: 1.0000 | PACK | Freq: Once | 0 refills | Status: AC

## 2024-08-07 NOTE — Addendum Note (Signed)
 Addended by: ELBY WADDELL CROME on: 08/07/2024 11:21 AM   Modules accepted: Orders

## 2024-08-07 NOTE — Progress Notes (Signed)
 Patient was seen for Gestational Diabetes on 08/07/2024  Start time 0813 and End time 0916  Estimated due date: 11/01/2024; [redacted]w[redacted]d  Spanish Interpreter from [employer]; #[id number]  Clinical: Medications: Current Medications[1]  Medical History:  Past Medical History:  Diagnosis Date   Depression    Headache     Labs: OGTT fasting 100, 1 hour 166, 2 hour 172 on 07/30/2024 Lab Results  Component Value Date   HGBA1C 5.0 10/05/2016   Dietary and Lifestyle History: Pt presents today alone with freestyle lite strips and freestyle lancets. Pt reports her glucose monitoring supplies were affordable and denies receiving a meter kit-RD sent request to clinical team for rx to be sent to Pt preferred pharmacy.  Pt reports she is working full time mostly walking stating different shifts. Pt reports she does the cooking and shopping. Pt reports she has made the following changes including decreased intake of desserts and sugary sweetened beverages. Pt reports receiving WIC benefits. Pt reports her meals times differ day to day. All Pt's questions were answered during this encounter.  Physical Activity: walking at work  Stress: 5 out of 10 / self care includes: sleep Sleep: 6-7 hours nightly   24 hr Recall:  First Meal: banana flavored cereal with 1% milk, tea with lemon Snack:  none Second meal: 1/2 grilled chicken sandwich on bun, lettuce, tomato, avocado, mayo, lemonade Snack:  none Third meal:  1/2 grilled chicken sandwich on bun, lettuce, tomato, avocado, mayo,water or rice, beans, grilled chicken, salad with dressing, water or juice Snack:  none Beverages:  1% milk, lemonade home made with sugar and cucumber, water, juice, tea with lemon  NUTRITION INTERVENTION  Nutrition education (E-1) on the following topics:   Initial Follow-up  [x]  []  Definition of Gestational Diabetes [x]  []  Why dietary management is important in controlling blood glucose Poorly controlled diabetes can  lead to fetal macrosomia, shoulder dystocia and neurological injuries, stillbirth and neonatal complications including respiratory distress syndrome, hypoglycemia and prolonged NICU admission.  [x]  []  Effects each nutrient has on blood glucose levels [x]  []  Simple carbohydrates vs complex carbohydrates [x]  []  Fluid intake [x]  []  Creating a balanced meal plan [x]  []  Carbohydrate counting  [x]  []  When to check blood glucose levels [x]  []  Proper blood glucose monitoring techniques [x]  []  Effect of stress and stress reduction techniques  [x]  []  Exercise effect on blood glucose levels, appropriate exercise during pregnancy [x]  []  Importance of limiting caffeine and abstaining from alcohol and smoking [x]  []  Medications used for blood sugar control during pregnancy [x]  []  Hypoglycemia and rule of 15 [x]  []  Postpartum self care   RD sent request to referring provider for meter- Patient has a meter prior to visit. Patient is instructed to begin testing pre breakfast and 2 hours after each meal. CBG: 97 mg/dL, reported as fasting per Pt    Patient instructed to monitor glucose levels: QID FBS: 60 - <= 95 mg/dL; 2 hour: <= 879 mg/dL  Patient received handouts: Spanish Nutrition Diabetes and Pregnancy Carbohydrate Counting List Blood glucose log Snack ideas for diabetes during pregnancy Plate Planner  Patient will be seen for follow-up as needed.     [1]  Current Outpatient Medications:    aspirin  EC 81 MG tablet, Take 1 tablet (81 mg total) by mouth daily. Swallow whole., Disp: 90 tablet, Rfl: 3   ferrous sulfate 325 (65 FE) MG tablet, Take 325 mg by mouth daily with breakfast., Disp: , Rfl:    glucose blood test strip,  Check blood glucose four times a day, Disp: 100 each, Rfl: 12   Lancets (FREESTYLE) lancets, Use as instructed, Disp: 100 each, Rfl: 12   meclizine  (ANTIVERT ) 12.5 MG tablet, Take 1 tablet (12.5 mg total) by mouth 3 (three) times daily as needed for dizziness., Disp: 30  tablet, Rfl: 0   OVER THE COUNTER MEDICATION, as needed. Aspirin . Pt does not know the dose., Disp: , Rfl:    pantoprazole  (PROTONIX ) 40 MG tablet, Take 1 tablet (40 mg total) by mouth daily., Disp: 30 tablet, Rfl: 1   Prenatal Vit-Fe Fumarate-FA (MULTIVITAMIN-PRENATAL) 27-0.8 MG TABS tablet, Take 1 tablet by mouth daily., Disp: 90 tablet, Rfl: 1

## 2024-08-13 ENCOUNTER — Ambulatory Visit (INDEPENDENT_AMBULATORY_CARE_PROVIDER_SITE_OTHER): Admitting: Obstetrics and Gynecology

## 2024-08-13 VITALS — BP 116/72 | HR 76 | Wt 173.6 lb

## 2024-08-13 DIAGNOSIS — O24410 Gestational diabetes mellitus in pregnancy, diet controlled: Secondary | ICD-10-CM

## 2024-08-13 DIAGNOSIS — Z758 Other problems related to medical facilities and other health care: Secondary | ICD-10-CM | POA: Diagnosis not present

## 2024-08-13 DIAGNOSIS — O09523 Supervision of elderly multigravida, third trimester: Secondary | ICD-10-CM | POA: Diagnosis not present

## 2024-08-13 DIAGNOSIS — O0993 Supervision of high risk pregnancy, unspecified, third trimester: Secondary | ICD-10-CM

## 2024-08-13 DIAGNOSIS — Z8759 Personal history of other complications of pregnancy, childbirth and the puerperium: Secondary | ICD-10-CM

## 2024-08-13 DIAGNOSIS — O24419 Gestational diabetes mellitus in pregnancy, unspecified control: Secondary | ICD-10-CM | POA: Insufficient documentation

## 2024-08-13 DIAGNOSIS — Z603 Acculturation difficulty: Secondary | ICD-10-CM | POA: Diagnosis not present

## 2024-08-13 DIAGNOSIS — O0990 Supervision of high risk pregnancy, unspecified, unspecified trimester: Secondary | ICD-10-CM

## 2024-08-13 DIAGNOSIS — Z3A28 28 weeks gestation of pregnancy: Secondary | ICD-10-CM | POA: Diagnosis not present

## 2024-08-13 NOTE — Progress Notes (Signed)
° °  PRENATAL VISIT NOTE  Subjective:  Nicole Little is a 43 y.o. [phone] at [redacted]w[redacted]d being seen today for ongoing prenatal care.  She is currently monitored for the following issues for this high-risk pregnancy and has History of IUFD; Language barrier; AMA (advanced maternal age) multigravida 35+; Supervision of high risk pregnancy, antepartum; Unwanted fertility; and Gestational diabetes on their problem list.  Patient doing well with no acute concerns today. She reports no complaints.  Contractions: Irritability. Vag. Bleeding: None.  Movement: Present. Denies leaking of fluid.   The following portions of the patient's history were reviewed and updated as appropriate: allergies, current medications, past family history, past medical history, past social history, past surgical history and problem list. Problem list updated.  Objective:   Vitals:   08/13/24 0829  BP: 116/72  Pulse: 76  Weight: 173 lb 9.6 oz (78.7 kg)    Fetal Status: Fetal Heart Rate (bpm): 152 Fundal Height: 28 cm Movement: Present     General:  Alert, oriented and cooperative. Patient is in no acute distress.  Skin: Skin is warm and dry. No rash noted.   Cardiovascular: Normal heart rate noted  Respiratory: Normal respiratory effort, no problems with respiration noted  Abdomen: Soft, gravid, appropriate for gestational age.  Pain/Pressure: Present     Pelvic: Cervical exam deferred        Extremities: Normal range of motion.  Edema: None  Mental Status:  Normal mood and affect. Normal behavior. Normal judgment and thought content.   Assessment and Plan:  Pregnancy: [id number] at [redacted]w[redacted]d  1. Supervision of high risk pregnancy, antepartum (Primary) Continue routine prenatal care FHT initially elevated, but recheck was normal x 2  2. [redacted] weeks gestation of pregnancy   3. Language barrier Live interpreter present  4. History of IUFD Per MFM start testing at 32 weeks 2/2 hx  5. Multigravida of advanced  maternal age in third trimester   6. Diet controlled gestational diabetes mellitus (GDM) in third trimester Pt just received supplies, emphasized need for recording blood sugars Growth scan and BPP previously scheduled  Preterm labor symptoms and general obstetric precautions including but not limited to vaginal bleeding, contractions, leaking of fluid and fetal movement were reviewed in detail with the patient.  Please refer to After Visit Summary for other counseling recommendations.   Return in about 2 weeks (around 08/27/2024) for [facility], in person.   Jerilynn Buddle, MD Faculty Attending Center for [facility]

## 2024-08-16 ENCOUNTER — Encounter (HOSPITAL_COMMUNITY): Payer: Self-pay | Admitting: Obstetrics and Gynecology

## 2024-08-16 ENCOUNTER — Inpatient Hospital Stay (HOSPITAL_COMMUNITY)
Admission: AD | Admit: 2024-08-16 | Discharge: 2024-08-16 | Disposition: A | Discharge status: Home / Self Care | Attending: Obstetrics and Gynecology | Admitting: Obstetrics and Gynecology

## 2024-08-16 DIAGNOSIS — R42 Dizziness and giddiness: Secondary | ICD-10-CM | POA: Diagnosis present

## 2024-08-16 DIAGNOSIS — O24410 Gestational diabetes mellitus in pregnancy, diet controlled: Secondary | ICD-10-CM | POA: Insufficient documentation

## 2024-08-16 DIAGNOSIS — Z3A29 29 weeks gestation of pregnancy: Secondary | ICD-10-CM | POA: Insufficient documentation

## 2024-08-16 DIAGNOSIS — Z711 Person with feared health complaint in whom no diagnosis is made: Secondary | ICD-10-CM

## 2024-08-16 DIAGNOSIS — O26891 Other specified pregnancy related conditions, first trimester: Secondary | ICD-10-CM | POA: Diagnosis not present

## 2024-08-16 HISTORY — DX: Type 2 diabetes mellitus without complications: E11.9

## 2024-08-16 LAB — CBC
HCT: 34.6 % — ABNORMAL LOW (ref 36.0–46.0)
Hemoglobin: 11.5 g/dL — ABNORMAL LOW (ref 12.0–15.0)
MCH: 29 pg (ref 26.0–34.0)
MCHC: 33.2 g/dL (ref 30.0–36.0)
MCV: 87.4 fL (ref 80.0–100.0)
Platelets: 249 K/uL (ref 150–400)
RBC: 3.96 MIL/uL (ref 3.87–5.11)
RDW: 12.9 % (ref 11.5–15.5)
WBC: 11.1 K/uL — ABNORMAL HIGH (ref 4.0–10.5)
nRBC: 0 % (ref 0.0–0.2)

## 2024-08-16 LAB — GLUCOSE, CAPILLARY: Glucose-Capillary: 91 mg/dL (ref 70–99)

## 2024-08-16 NOTE — MAU Provider Note (Signed)
 Chief Complaint:  Hypoglycemia   Event Date/Time   First Provider Initiated Contact with Patient 08/16/24 2315      HPI: Nicole Little is a 43 y.o. [id number] at [redacted]w[redacted]d who presents to maternity admissions with concern for low blood sugar. Experienced mildly dizziness today that has since resolved. Newly diagnosed with gestational diabetes.  Not on any medicines for diabetes.  CBG at work and it was 97. Ate food and rechecked 2 hours later and it was 94 and she was concerned that it was dropping.   Denies contractions, leakage of fluid or vaginal bleeding. Good fetal movement.   Pregnancy Course:   Past Medical History:  Diagnosis Date   Depression    Diabetes mellitus without complication (HCC)    Headache    OB History  Gravida Para Term Preterm AB Living  6 4 3 1 1 3   SAB IAB Ectopic Multiple Live Births  1 0 0 0 3    # Outcome Date GA Lbr Len/2nd Weight Sex Type Anes PTL Lv  6 Current           5 SAB 06/2023 [redacted]w[redacted]d    SAB     4 Term 03/14/17 [redacted]w[redacted]d 06:58 / 00:07 3084 g F Vag-Spont None  LIV  3 Term 05/08/06 [redacted]w[redacted]d  2920 g F Vag-Spont   LIV  2 Term 06/29/04 [redacted]w[redacted]d  3005 g M Vag-Spont   LIV  1 Preterm 06/14/03 [redacted]w[redacted]d  1814 g  Vag-Spont   FD    Obstetric Comments  36 week IUFD in 2004   Past Surgical History:  Procedure Laterality Date   BREAST BIOPSY Right 10/04/2022   US  RT BREAST BX W LOC DEV 1ST LESION IMG BX SPEC US  GUIDE 10/04/2022 [facility] MAMMOGRAPHY   BREAST BIOPSY Left 10/04/2022   US  LT BREAST BX W LOC DEV 1ST LESION IMG BX SPEC US  GUIDE 10/04/2022 [facility] MAMMOGRAPHY   Family History  Problem Relation Age of Onset   Diabetes Mother    Diabetes Father    Kidney disease Father        dialysis   BRCA 1/2 Neg Hx    Breast cancer Neg Hx    Social History[1] Allergies[2] Medications Prior to Admission  Medication Sig Dispense Refill Last Dose/Taking   aspirin  EC 81 MG tablet Take 1 tablet (81 mg total) by mouth daily. Swallow whole. 90 tablet 3 08/15/2024    ferrous sulfate 325 (65 FE) MG tablet Take 325 mg by mouth daily with breakfast.   08/15/2024   pantoprazole  (PROTONIX ) 40 MG tablet Take 1 tablet (40 mg total) by mouth daily. 30 tablet 1 08/15/2024   Prenatal Vit-Fe Fumarate-FA (MULTIVITAMIN-PRENATAL) 27-0.8 MG TABS tablet Take 1 tablet by mouth daily. 90 tablet 1 08/15/2024   glucose blood test strip Check blood glucose four times a day 100 each 12    Lancets (FREESTYLE) lancets Use as instructed 100 each 12    meclizine  (ANTIVERT ) 12.5 MG tablet Take 1 tablet (12.5 mg total) by mouth 3 (three) times daily as needed for dizziness. 30 tablet 0    OVER THE COUNTER MEDICATION as needed. Aspirin . Pt does not know the dose.       I have reviewed patient's Past Medical Hx, Surgical Hx, Family Hx, Social Hx, medications and allergies.   ROS:  Review of Systems  Constitutional:  Negative for chills and fever.  Gastrointestinal:  Negative for abdominal pain.  Genitourinary:  Negative for vaginal bleeding.    Physical  Exam  Patient Vitals for the past 24 hrs:  BP Temp Temp src Pulse Resp SpO2 Height Weight  08/16/24 2228 (!) 125/59 98.1 F (36.7 C) Oral 88 17 96 % 5' 2 (1.575 m) 80.4 kg  08/16/24 2224 (!) 125/59 -- -- -- -- -- -- --   Constitutional: Well-developed, well-nourished female in no acute distress.  Cardiovascular: normal rate Respiratory: normal effort GI: Abd gravid appropriate for gestational age.  MS: Extremities nontender, no edema, normal ROM Neurologic: Alert and oriented x 4.  GU: deferred  FHT:  Baseline 140 , moderate variability, accelerations present, no decelerations Contractions: none   Labs: Results for orders placed or performed during the hospital encounter of 08/16/24 (from the past 24 hours)  Glucose, capillary     Status: None   Collection Time: 08/16/24 10:48 PM  Result Value Ref Range   Glucose-Capillary 91 70 - 99 mg/dL  CBC     Status: Abnormal   Collection Time: 08/16/24 10:55 PM  Result Value  Ref Range   WBC 11.1 (H) 4.0 - 10.5 K/uL   RBC 3.96 3.87 - 5.11 MIL/uL   Hemoglobin 11.5 (L) 12.0 - 15.0 g/dL   HCT 65.3 (L) 63.9 - 53.9 %   MCV 87.4 80.0 - 100.0 fL   MCH 29.0 26.0 - 34.0 pg   MCHC 33.2 30.0 - 36.0 g/dL   RDW 87.0 88.4 - 84.4 %   Platelets 249 150 - 400 K/uL   nRBC 0.0 0.0 - 0.2 %    Imaging:  NA  MAU Course: Orders Placed This Encounter  Procedures   CBC   Glucose, capillary   Discharge patient   No orders of the defined types were placed in this encounter.   MDM: - Reassured pt that her blood sugars were nml at home an din MAU and that is is highly unlikely to have dangerously low blood sugar without taking medication for diabetes. Discussed Nml range and what to eat for low blood sugar. Pt verbalized understanding.   Assessment: 1. Physically well but worried   2. Dizziness   3. [redacted] weeks gestation of pregnancy   4. Diet controlled gestational diabetes mellitus (GDM) in third trimester     Plan: Discharge home in stable condition.  preterm Labor precautions and fetal kick counts  Follow-up Information     [facility] for [facility] Follow up.   Specialty: Obstetrics and Gynecology Why: Routine prenatal visit Contact information: [address] [place]  [address] [phone]        [facility] Follow up.   Specialty: Obstetrics and Gynecology Why: As needed in emergencies, As needed if symptoms worsen Contact information: [address] [place]  [phone]                Allergies as of 08/16/2024       Reactions   Penicillins Other (See Comments), Rash, Dermatitis   Has patient had a PCN reaction causing immediate rash, facial/tongue/throat swelling, NO,SOB or lightheadedness with hypotension: No Has patient had a PCN reaction causing severe rash involving mucus membranes or skin necrosis: No Has patient had a PCN  reaction that required hospitalization: No Has patient had a PCN reaction occurring within the last 10 years: No If all of the above answers are NO, then may proceed with Cephalosporin use. Rash and itching on skin only- with first preg 2004 Has patient had a PCN reaction causing  immediate rash, facial/tongue/throat swelling, NO,SOB or lightheadedness with hypotension: No Has patient had a PCN reaction causing severe rash involving mucus membranes or skin necrosis: No Has patient had a PCN reaction that required hospitalization: No Has patient had a PCN reaction occurring within the last 10 years: No If all of the above answers are NO, then may proceed with Cephalosporin use. Rash and itching on skin only- with first preg 2004        Medication List     TAKE these medications    aspirin  EC 81 MG tablet Take 1 tablet (81 mg total) by mouth daily. Swallow whole.   ferrous sulfate 325 (65 FE) MG tablet Take 325 mg by mouth daily with breakfast.   freestyle lancets Use as instructed   glucose blood test strip Check blood glucose four times a day   meclizine  12.5 MG tablet Commonly known as: ANTIVERT  Take 1 tablet (12.5 mg total) by mouth 3 (three) times daily as needed for dizziness.   multivitamin-prenatal 27-0.8 MG Tabs tablet Take 1 tablet by mouth daily.   OVER THE COUNTER MEDICATION as needed. Aspirin . Pt does not know the dose.   pantoprazole  40 MG tablet Commonly known as: Protonix  Take 1 tablet (40 mg total) by mouth daily.        Claudene Rams , CNM 08/16/2024 11:25 PM      [1]  Social History Tobacco Use   Smoking status: Never   Smokeless tobacco: Never  Vaping Use   Vaping status: Never Used  Substance Use Topics   Alcohol use: Not Currently   Drug use: No  [2]  Allergies Allergen Reactions   Penicillins Other (See Comments), Rash and Dermatitis    Has patient had a PCN reaction causing immediate rash, facial/tongue/throat swelling, NO,SOB  or lightheadedness with hypotension: No  Has patient had a PCN reaction causing severe rash involving mucus membranes or skin necrosis: No  Has patient had a PCN reaction that required hospitalization: No  Has patient had a PCN reaction occurring within the last 10 years: No  If all of the above answers are NO, then may proceed with Cephalosporin use.  Rash and itching on skin only- with first preg 2004  Has patient had a PCN reaction causing immediate rash, facial/tongue/throat swelling, NO,SOB or lightheadedness with hypotension: No Has patient had a PCN reaction causing severe rash involving mucus membranes or skin necrosis: No Has patient had a PCN reaction that required hospitalization: No Has patient had a PCN reaction occurring within the last 10 years: No If all of the above answers are NO, then may proceed with Cephalosporin use. Rash and itching on skin only- with first preg 2004

## 2024-08-16 NOTE — MAU Note (Signed)
 Mikena E Kenady Doxtater is a 43 y.o. at [redacted]w[redacted]d here in MAU reporting:    Pt states she was diagnosed with GDM and was told to check her her blood sugars. At 1640 glucose was 97 and at 2100 she was 94 glucose. Pt states at 1640 she started feeling dizzy with blurry vision so she drunk a coke soda. Pt states she just wants to make sure her sugar is not too low.   No LOF, no bleeding. No pain.       Pain score: no pain Vitals:   08/16/24 2224 08/16/24 2228  BP: (!) 125/59 (!) 125/59  Pulse:  88  Resp:  17  Temp:  98.1 F (36.7 C)  SpO2:  96%     FHT: 143 Lab orders placed from triage:

## 2024-08-26 ENCOUNTER — Other Ambulatory Visit: Payer: Self-pay

## 2024-08-26 DIAGNOSIS — O24410 Gestational diabetes mellitus in pregnancy, diet controlled: Secondary | ICD-10-CM

## 2024-08-27 ENCOUNTER — Ambulatory Visit (INDEPENDENT_AMBULATORY_CARE_PROVIDER_SITE_OTHER): Admitting: Family Medicine

## 2024-08-27 ENCOUNTER — Other Ambulatory Visit

## 2024-08-27 ENCOUNTER — Other Ambulatory Visit: Payer: Self-pay

## 2024-08-27 VITALS — BP 111/73 | HR 75 | Wt 179.1 lb

## 2024-08-27 DIAGNOSIS — O0993 Supervision of high risk pregnancy, unspecified, third trimester: Secondary | ICD-10-CM | POA: Diagnosis not present

## 2024-08-27 DIAGNOSIS — Z3A30 30 weeks gestation of pregnancy: Secondary | ICD-10-CM

## 2024-08-27 DIAGNOSIS — Z758 Other problems related to medical facilities and other health care: Secondary | ICD-10-CM | POA: Diagnosis not present

## 2024-08-27 DIAGNOSIS — O0990 Supervision of high risk pregnancy, unspecified, unspecified trimester: Secondary | ICD-10-CM

## 2024-08-27 DIAGNOSIS — Z3009 Encounter for other general counseling and advice on contraception: Secondary | ICD-10-CM | POA: Diagnosis not present

## 2024-08-27 DIAGNOSIS — Z603 Acculturation difficulty: Secondary | ICD-10-CM | POA: Diagnosis not present

## 2024-08-27 DIAGNOSIS — Z8759 Personal history of other complications of pregnancy, childbirth and the puerperium: Secondary | ICD-10-CM | POA: Diagnosis not present

## 2024-08-27 DIAGNOSIS — O09523 Supervision of elderly multigravida, third trimester: Secondary | ICD-10-CM | POA: Diagnosis not present

## 2024-08-27 DIAGNOSIS — O24410 Gestational diabetes mellitus in pregnancy, diet controlled: Secondary | ICD-10-CM

## 2024-08-27 NOTE — Progress Notes (Signed)
 "  PRENATAL VISIT NOTE  Subjective:  Nicole Little is a 43 y.o. [phone] at [redacted]w[redacted]d being seen today for ongoing prenatal care.  She is currently monitored for the following issues for this high-risk pregnancy and has History of IUFD; Language barrier; AMA (advanced maternal age) multigravida 35+; Supervision of high risk pregnancy, antepartum; Unwanted fertility; and Gestational diabetes on their problem list.  Patient reports no bleeding, no contractions, no cramping, and no leaking.  Contractions: Irritability. Vag. Bleeding: None.  Movement: Present. Denies leaking of fluid.   The following portions of the patient's history were reviewed and updated as appropriate: allergies, current medications, past family history, past medical history, past social history, past surgical history and problem list.   Objective:   Vitals:   08/27/24 0828  BP: 111/73  Pulse: 75  Weight: 179 lb 1.6 oz (81.2 kg)    Fetal Status:  Fetal Heart Rate (bpm): 139   Movement: Present    General: Alert, oriented and cooperative. Patient is in no acute distress.  Skin: Skin is warm and dry. No rash noted.   Cardiovascular: Normal heart rate noted  Respiratory: Normal respiratory effort, no problems with respiration noted  Abdomen: Soft, gravid, appropriate for gestational age.  Pain/Pressure: Present     Pelvic: Cervical exam deferred        Extremities: Normal range of motion.  Edema: None  Mental Status: Normal mood and affect. Normal behavior. Normal judgment and thought content.      04/29/2024    5:24 PM 03/12/2017   12:45 PM 03/08/2017    1:45 PM  Depression screen PHQ 2/9  Decreased Interest 1 0 0  Down, Depressed, Hopeless 0 0 0  PHQ - 2 Score 1 0 0  Altered sleeping 0 0 0  Tired, decreased energy 3 0 0  Change in appetite 0 0 0  Feeling bad or failure about yourself  0 0 0  Trouble concentrating 0 0 0  Moving slowly or fidgety/restless 0 0 0  Suicidal thoughts 0 0 0  PHQ-9 Score 4  0   0      Data saved with a previous flowsheet row definition        04/29/2024    5:25 PM 03/12/2017   12:45 PM 03/08/2017    1:45 PM 03/01/2017   12:45 PM  GAD 7 : Generalized Anxiety Score  Nervous, Anxious, on Edge 0 0 0 0  Control/stop worrying 0 0 0 0  Worry too much - different things 2 0 0 0  Trouble relaxing 0 0 0 0  Restless 0 0 0 0  Easily annoyed or irritable 0 0 0 0  Afraid - awful might happen 2 0 0 0  Total GAD 7 Score 4 0 0 0    Assessment and Plan:  Pregnancy: H3E6886 at 106w4d 1. Supervision of high risk pregnancy, antepartum (Primary) FHR and BP appropriate today  2. Diet controlled gestational diabetes mellitus (GDM) in third trimester Blood glucose is well-controlled with only a few out of range. Diet controlled, will continue to monitor.  Scheduled for growth ultrasounds every 4 weeks.  3. Language barrier Spanish interpreter used for entire visit  4. History of IUFD Planning on antenatal testing starting at 32 weeks.  Has been scheduled for serial growth ultrasounds with the next one being 1/8 NSTs weekly starting at 32 weeks  5. Unwanted fertility Has [employer] insurance No need for tubal papers to be signed Patient  still interested in this and had discussion about procedure.  6. Multigravida of advanced maternal age in third trimester Low risk nips  7. [redacted] weeks gestation of pregnancy   Preterm labor symptoms and general obstetric precautions including but not limited to vaginal bleeding, contractions, leaking of fluid and fetal movement were reviewed in detail with the patient. Please refer to After Visit Summary for other counseling recommendations.   No follow-ups on file.  Future Appointments  Date Time Provider Department Center  09/04/2024  7:15 AM WMC-MFC PROVIDER 1 [facility]  09/04/2024  7:30 AM [facility]-MFC US1 [facility]  09/08/2024  8:15 AM [facility]CWH US2 [facility]  09/10/2024  8:55 AM Fredirick Glenys RAMAN, MD [facility]  09/17/2024   2:15 PM WMC-WOCA NST [facility]  09/23/2024  1:15 PM Milly Olam DELENA EDDY [facility]  09/25/2024  7:15 AM WMC-MFC PROVIDER 1 [facility]MFC [facility]  09/25/2024  7:30 AM WMC-MFC US3 [facility]  10/01/2024 11:15 AM Fredirick Glenys RAMAN, MD [facility]  10/08/2024  8:55 AM WMC-GENERAL 1 W[facility]-CWH [facility]  10/15/2024 10:55 AM WMC-GENERAL 1 [facility]  10/22/2024 10:55 AM WMC-GENERAL 1 W[facility]-CWH [facility]    Norleen LULLA Rover, MD  "

## 2024-08-29 ENCOUNTER — Encounter

## 2024-09-04 ENCOUNTER — Ambulatory Visit (HOSPITAL_BASED_OUTPATIENT_CLINIC_OR_DEPARTMENT_OTHER)

## 2024-09-04 ENCOUNTER — Ambulatory Visit: Attending: Obstetrics | Admitting: Obstetrics

## 2024-09-04 VITALS — BP 120/65 | HR 66

## 2024-09-04 DIAGNOSIS — O24410 Gestational diabetes mellitus in pregnancy, diet controlled: Secondary | ICD-10-CM | POA: Diagnosis not present

## 2024-09-04 DIAGNOSIS — Z3A31 31 weeks gestation of pregnancy: Secondary | ICD-10-CM | POA: Insufficient documentation

## 2024-09-04 DIAGNOSIS — Z8759 Personal history of other complications of pregnancy, childbirth and the puerperium: Secondary | ICD-10-CM

## 2024-09-04 DIAGNOSIS — E669 Obesity, unspecified: Secondary | ICD-10-CM | POA: Diagnosis not present

## 2024-09-04 DIAGNOSIS — O09523 Supervision of elderly multigravida, third trimester: Secondary | ICD-10-CM | POA: Insufficient documentation

## 2024-09-04 DIAGNOSIS — O09522 Supervision of elderly multigravida, second trimester: Secondary | ICD-10-CM

## 2024-09-04 DIAGNOSIS — O99213 Obesity complicating pregnancy, third trimester: Secondary | ICD-10-CM

## 2024-09-04 DIAGNOSIS — O09293 Supervision of pregnancy with other poor reproductive or obstetric history, third trimester: Secondary | ICD-10-CM

## 2024-09-04 NOTE — Progress Notes (Signed)
 MFM Consult Note  Nicole Little is currently at [redacted]w[redacted]d. She has been followed due to advanced maternal age (45 years old) and diet-controlled gestational diabetes.  She has a history of a prior IUFD at 36+ weeks.  She denies any problems since her last exam and reports feeling fetal movements throughout the day.    Her blood pressure today was 120/65.  I reviewed her fingerstick logs today.  Her fasting fingerstick values were elevated and mostly above 100.  She did have a few elevated 2-hour postprandial levels.  Sonographic findings Single intrauterine pregnancy at 31w 5d.  Fetal cardiac activity:  Observed and appears normal. Presentation: Breech. Fetal biometry shows the estimated fetal weight of 3 lb 15 oz,  1800g (35%). Amniotic fluid volume: . AFI: 14.05cm.  MVP: 4.15 cm. Placenta: Anterior.  She had a reactive NST today.  Gestational Diabetes   She was advised to continue to monitor her fingersticks 4 times daily (fasting and 2 hours after each meal).    She was advised that our goals for her fingerstick values are fasting values of 90-95 or less and two-hour postprandial values of 120 or less.    Should her fasting fingerstick values continue to be in the 100s range, consideration should be given to starting metformin  at bedtime to help normalize her fasting fingerstick values.  Due to advanced maternal age, her history of her prior IUFD, and gestational diabetes, she should continue to to be followed with weekly fetal testing until delivery.  She already has weekly NSTs scheduled for the next few weeks.  Due to her history of a prior fetal demise at 36+ weeks and gestational diabetes, delivery may be considered at between 31 to 38 weeks.    The patient stated that all of her questions were answered today.    All conversations were held with the patient today with the help of a Spanish interpreter.  A total of 20 minutes was spent counseling and coordinating  the care for this patient.  Greater than 50% of the time was spent in direct face-to-face contact.

## 2024-09-04 NOTE — Procedures (Signed)
 MAZEL VILLELA Chicas 1981-07-20 [redacted]w[redacted]d  Fetus A Non-Stress Test Interpretation for 09/04/2024  Indication: Advanced Maternal Age >40 years  Fetal Heart Rate A Mode: External Baseline Rate (A): 130 bpm Variability: Moderate Accelerations: 15 x 15 Decelerations: None Multiple birth?: No  Uterine Activity Mode: Toco, Palpation Contraction Frequency (min): none noted Resting Tone Palpated: Relaxed  Interpretation (Fetal Testing) Nonstress Test Interpretation: Reactive Comments: Reviewed with Dr. Ileana

## 2024-09-08 ENCOUNTER — Other Ambulatory Visit

## 2024-09-10 ENCOUNTER — Ambulatory Visit: Admitting: Family Medicine

## 2024-09-10 ENCOUNTER — Ambulatory Visit: Payer: Self-pay | Admitting: *Deleted

## 2024-09-10 VITALS — BP 118/69 | HR 80 | Wt 179.0 lb

## 2024-09-10 DIAGNOSIS — O24415 Gestational diabetes mellitus in pregnancy, controlled by oral hypoglycemic drugs: Secondary | ICD-10-CM

## 2024-09-10 DIAGNOSIS — O09523 Supervision of elderly multigravida, third trimester: Secondary | ICD-10-CM

## 2024-09-10 DIAGNOSIS — Z603 Acculturation difficulty: Secondary | ICD-10-CM | POA: Diagnosis not present

## 2024-09-10 DIAGNOSIS — Z3A32 32 weeks gestation of pregnancy: Secondary | ICD-10-CM | POA: Diagnosis not present

## 2024-09-10 DIAGNOSIS — O0993 Supervision of high risk pregnancy, unspecified, third trimester: Secondary | ICD-10-CM

## 2024-09-10 DIAGNOSIS — Z8759 Personal history of other complications of pregnancy, childbirth and the puerperium: Secondary | ICD-10-CM

## 2024-09-10 DIAGNOSIS — Z758 Other problems related to medical facilities and other health care: Secondary | ICD-10-CM

## 2024-09-10 DIAGNOSIS — O0990 Supervision of high risk pregnancy, unspecified, unspecified trimester: Secondary | ICD-10-CM

## 2024-09-10 MED ORDER — METFORMIN HCL 500 MG PO TABS: 500.0000 mg | ORAL_TABLET | Freq: Two times a day (BID) | ORAL | 5 refills | Status: AC

## 2024-09-10 NOTE — Addendum Note (Signed)
 Addended by: MICHAE LOA CROME on: 09/10/2024 10:22 AM   Modules accepted: Orders

## 2024-09-10 NOTE — Progress Notes (Addendum)
 "  PRENATAL VISIT NOTE  Subjective:  Nicole Little is a 44 y.o. [phone] at [redacted]w[redacted]d being seen today for ongoing prenatal care.  She is currently monitored for the following issues for this high-risk pregnancy and has History of IUFD; Language barrier; AMA (advanced maternal age) multigravida 35+; Supervision of high risk pregnancy, antepartum; Unwanted fertility; and Gestational diabetes on their problem list.  Patient reports contractions since daily that last 10 min-1 hour.  Contractions: Not present. Vag. Bleeding: None.  Movement: Present. Denies leaking of fluid.   The following portions of the patient's history were reviewed and updated as appropriate: allergies, current medications, past family history, past medical history, past social history, past surgical history and problem list.   Objective:   Vitals:   09/10/24 0830  BP: 118/69  Pulse: 80  Weight: 179 lb (81.2 kg)    Fetal Status:  Fetal Heart Rate (bpm): 140 Fundal Height: 31 cm Movement: Present    General: Alert, oriented and cooperative. Patient is in no acute distress.  Skin: Skin is warm and dry. No rash noted.   Cardiovascular: Normal heart rate noted  Respiratory: Normal respiratory effort, no problems with respiration noted  Abdomen: Soft, gravid, appropriate for gestational age.  Pain/Pressure: Absent     Pelvic: Cervical exam deferred        Extremities: Normal range of motion.  Edema: None  Mental Status: Normal mood and affect. Normal behavior. Normal judgment and thought content.  NST:  Baseline: 140 bpm, Variability: Good {> 6 bpm), Accelerations: Reactive, and Decelerations: Absent      04/29/2024    5:24 PM 03/12/2017   12:45 PM 03/08/2017    1:45 PM  Depression screen PHQ 2/9  Decreased Interest 1 0 0  Down, Depressed, Hopeless 0 0 0  PHQ - 2 Score 1 0 0  Altered sleeping 0 0 0  Tired, decreased energy 3 0 0  Change in appetite 0 0 0  Feeling bad or failure about yourself  0 0 0   Trouble concentrating 0 0 0  Moving slowly or fidgety/restless 0 0 0  Suicidal thoughts 0 0 0  PHQ-9 Score 4  0  0      Data saved with a previous flowsheet row definition        04/29/2024    5:25 PM 03/12/2017   12:45 PM 03/08/2017    1:45 PM 03/01/2017   12:45 PM  GAD 7 : Generalized Anxiety Score  Nervous, Anxious, on Edge 0 0 0 0  Control/stop worrying 0 0 0 0  Worry too much - different things 2 0 0 0  Trouble relaxing 0 0 0 0  Restless 0 0 0 0  Easily annoyed or irritable 0 0 0 0  Afraid - awful might happen 2 0 0 0  Total GAD 7 Score 4 0 0 0    Assessment and Plan:  Pregnancy: H3E6886 at [redacted]w[redacted]d 1. [redacted] weeks gestation of pregnancy (Primary)   2. Supervision of high risk pregnancy, antepartum Continue prenatal care.  3. Language barrier Spanish interpreter: video, Ari used  4. History of IUFD In tesing  5. Multigravida of advanced maternal age in third trimester LR NIPT  6. Gestational diabetes mellitus (GDM) in third trimester controlled on oral hypoglycemic drug See log    Most are out of range Begin Metformin  Last EFW 35% In testing and IOL 37-38 weeks, book next visit. - metFORMIN  (GLUCOPHAGE ) 500 MG tablet; Take 1 tablet (500  mg total) by mouth 2 (two) times daily with a meal.  Dispense: 60 tablet; Refill: 5  Preterm labor symptoms and general obstetric precautions including but not limited to vaginal bleeding, contractions, leaking of fluid and fetal movement were reviewed in detail with the patient. Please refer to After Visit Summary for other counseling recommendations.   Return in 2 weeks (on 09/24/2024) for OB visit and BPP,  BPP weekly.  Future Appointments  Date Time Provider Department Center  09/10/2024 10:15 AM [facility] NST [facility]  09/17/2024  2:15 PM W[facility]WOC[facility] NST [facility]  09/23/2024  1:15 PM Milly Olam DELENA EDDY [facility]  09/25/2024  7:15 AM WMC-MFC PROVIDER 1 [facility]  09/25/2024  7:30 AM WMC-MFC US3 [facility]   10/01/2024 11:15 AM Fredirick Glenys RAMAN, MD [facility]  10/08/2024  8:15 AM Delores Nidia CROME, FNP [facility]  10/15/2024  1:55 PM Ilean Norleen GAILS, MD [facility]  10/22/2024 10:55 AM Cleatus Moccasin, MD [facility]    Glenys RAMAN Fredirick, MD  "

## 2024-09-10 NOTE — Patient Instructions (Signed)
 Magnesium for Headache

## 2024-09-10 NOTE — Addendum Note (Signed)
 Addended by: MICHAE LOA CROME on: 09/10/2024 10:40 AM   Modules accepted: Orders

## 2024-09-17 ENCOUNTER — Other Ambulatory Visit: Payer: Self-pay

## 2024-09-17 ENCOUNTER — Ambulatory Visit: Payer: Self-pay | Admitting: General Practice

## 2024-09-17 VITALS — BP 123/65 | HR 83

## 2024-09-17 DIAGNOSIS — O09523 Supervision of elderly multigravida, third trimester: Secondary | ICD-10-CM

## 2024-09-17 DIAGNOSIS — Z3A33 33 weeks gestation of pregnancy: Secondary | ICD-10-CM

## 2024-09-17 NOTE — Progress Notes (Signed)
 Patient presents to office today for NST due to [facility] and GDM. Patient reports good fetal movement. She is checking her blood sugars & taking metformin  BID. NST reactive. Patient will follow up on Tuesday for regularly scheduled OB care.  Elenor DEL RN  09/17/24

## 2024-09-23 ENCOUNTER — Ambulatory Visit: Admitting: Advanced Practice Midwife

## 2024-09-23 ENCOUNTER — Ambulatory Visit

## 2024-09-23 ENCOUNTER — Other Ambulatory Visit: Payer: Self-pay

## 2024-09-23 VITALS — BP 119/77 | HR 89 | Wt 180.7 lb

## 2024-09-23 DIAGNOSIS — Z8759 Personal history of other complications of pregnancy, childbirth and the puerperium: Secondary | ICD-10-CM | POA: Diagnosis not present

## 2024-09-23 DIAGNOSIS — Z758 Other problems related to medical facilities and other health care: Secondary | ICD-10-CM

## 2024-09-23 DIAGNOSIS — Z3A34 34 weeks gestation of pregnancy: Secondary | ICD-10-CM | POA: Diagnosis not present

## 2024-09-23 DIAGNOSIS — O0990 Supervision of high risk pregnancy, unspecified, unspecified trimester: Secondary | ICD-10-CM

## 2024-09-23 DIAGNOSIS — O0993 Supervision of high risk pregnancy, unspecified, third trimester: Secondary | ICD-10-CM

## 2024-09-23 DIAGNOSIS — O24415 Gestational diabetes mellitus in pregnancy, controlled by oral hypoglycemic drugs: Secondary | ICD-10-CM

## 2024-09-23 DIAGNOSIS — O09523 Supervision of elderly multigravida, third trimester: Secondary | ICD-10-CM

## 2024-09-23 DIAGNOSIS — O24410 Gestational diabetes mellitus in pregnancy, diet controlled: Secondary | ICD-10-CM

## 2024-09-23 DIAGNOSIS — Z603 Acculturation difficulty: Secondary | ICD-10-CM | POA: Diagnosis not present

## 2024-09-23 NOTE — Progress Notes (Signed)
 "  PRENATAL VISIT NOTE  Subjective:  Nicole Little is a 44 y.o. [phone] at [redacted]w[redacted]d being seen today for ongoing prenatal care.  She is currently monitored for the following issues for this low-risk pregnancy and has History of IUFD; Language barrier; AMA (advanced maternal age) multigravida 35+; Supervision of high risk pregnancy, antepartum; Unwanted fertility; and Gestational diabetes on their problem list.  Patient reports occasional contractions.  Contractions: Irritability. Vag. Bleeding: None.  Movement: Present. Denies leaking of fluid.   The following portions of the patient's history were reviewed and updated as appropriate: allergies, current medications, past family history, past medical history, past social history, past surgical history and problem list.   Objective:   Vitals:   09/23/24 1326  BP: 119/77  Pulse: 89  Weight: 180 lb 11.2 oz (82 kg)    Fetal Status:  Fetal Heart Rate (bpm): 150 Fundal Height: 36 cm Movement: Present    General: Alert, oriented and cooperative. Patient is in no acute distress.  Skin: Skin is warm and dry. No rash noted.   Cardiovascular: Normal heart rate noted  Respiratory: Normal respiratory effort, no problems with respiration noted  Abdomen: Soft, gravid, appropriate for gestational age.  Pain/Pressure: Present     Pelvic: Cervical exam deferred        Extremities: Normal range of motion.  Edema: Trace  Mental Status: Normal mood and affect. Normal behavior. Normal judgment and thought content.      04/29/2024    5:24 PM 03/12/2017   12:45 PM 03/08/2017    1:45 PM  Depression screen PHQ 2/9  Decreased Interest 1 0 0  Down, Depressed, Hopeless 0 0 0  PHQ - 2 Score 1 0 0  Altered sleeping 0 0 0  Tired, decreased energy 3 0 0  Change in appetite 0 0 0  Feeling bad or failure about yourself  0 0 0  Trouble concentrating 0 0 0  Moving slowly or fidgety/restless 0 0 0  Suicidal thoughts 0 0 0  PHQ-9 Score 4  0  0      Data  saved with a previous flowsheet row definition        04/29/2024    5:25 PM 03/12/2017   12:45 PM 03/08/2017    1:45 PM 03/01/2017   12:45 PM  GAD 7 : Generalized Anxiety Score  Nervous, Anxious, on Edge 0  0  0  0   Control/stop worrying 0  0  0  0   Worry too much - different things 2  0  0  0   Trouble relaxing 0  0  0  0   Restless 0  0  0  0   Easily annoyed or irritable 0  0  0  0   Afraid - awful might happen 2  0  0  0   Total GAD 7 Score 4 0 0 0     Data saved with a previous flowsheet row definition    Assessment and Plan:  Pregnancy: H3E6886 at [redacted]w[redacted]d 1. Supervision of high risk pregnancy, antepartum (Primary) --Anticipatory guidance about next visits/weeks of pregnancy given.   2. Gestational diabetes mellitus (GDM) in third trimester controlled on oral hypoglycemic drug --Reviewed glucose log.  Prior to 10/23/24, fasting glucose values mostly between 95 and 120. PP overall wnl except for one or two outliers in the 130s.  After the patient started Metformin  on 10/22/24, no abnormal fasting or PP values.  --Pt reports she  is pleased with the medicine and her blood sugars and denies any side effects. --EFW 35%.  Normal FH today. --Continue current care.  --IOL recommended by MFM between 37 and 38 weeks, given hx IUFD and GDM on medication.  Pt is also AMA.   --IOL scheduled 2/19 or 2/20 with shared decision making.  Pt plans to work until 2/13.   3. Multigravida of advanced maternal age in third trimester --Antenatal testing  4. [redacted] weeks gestation of pregnancy   5. History of IUFD --Delivery at 36 weeks with G1, 3 subsequent term pregnancies without complication  6. Language barrier --spanish interpreter used for all communication  Preterm labor symptoms and general obstetric precautions including but not limited to vaginal bleeding, contractions, leaking of fluid and fetal movement were reviewed in detail with the patient. Please refer to After Visit Summary for  other counseling recommendations.   No follow-ups on file.  Future Appointments  Date Time Provider Department Center  09/23/2024  2:35 PM [facility] US2 [facility]  09/25/2024  7:15 AM W[facility]-M[facility] PROVIDER 1 [facility]  09/25/2024  7:30 AM WMC-MFC US3 [facility]  10/01/2024 10:15 AM [facility] US2 [facility]  10/01/2024 11:15 AM Fredirick Glenys RAMAN, MD [facility]  10/08/2024  8:15 AM Delores Nidia CROME, FNP [facility]  10/08/2024  8:55 AM [facility] US1 [facility]  10/15/2024  1:55 PM Ilean Norleen GAILS, MD [facility]  10/22/2024 10:55 AM Cleatus Moccasin, MD [facility]    Olam Boards, CNM  "

## 2024-09-24 ENCOUNTER — Encounter

## 2024-09-25 ENCOUNTER — Ambulatory Visit: Attending: Obstetrics | Admitting: Obstetrics

## 2024-09-25 ENCOUNTER — Other Ambulatory Visit: Payer: Self-pay | Admitting: *Deleted

## 2024-09-25 ENCOUNTER — Ambulatory Visit

## 2024-09-25 VITALS — BP 113/61 | HR 86

## 2024-09-25 DIAGNOSIS — O09293 Supervision of pregnancy with other poor reproductive or obstetric history, third trimester: Secondary | ICD-10-CM | POA: Insufficient documentation

## 2024-09-25 DIAGNOSIS — Z8759 Personal history of other complications of pregnancy, childbirth and the puerperium: Secondary | ICD-10-CM | POA: Diagnosis not present

## 2024-09-25 DIAGNOSIS — O321XX0 Maternal care for breech presentation, not applicable or unspecified: Secondary | ICD-10-CM | POA: Insufficient documentation

## 2024-09-25 DIAGNOSIS — O09523 Supervision of elderly multigravida, third trimester: Secondary | ICD-10-CM

## 2024-09-25 DIAGNOSIS — O24415 Gestational diabetes mellitus in pregnancy, controlled by oral hypoglycemic drugs: Secondary | ICD-10-CM | POA: Insufficient documentation

## 2024-09-25 DIAGNOSIS — O99213 Obesity complicating pregnancy, third trimester: Secondary | ICD-10-CM | POA: Insufficient documentation

## 2024-09-25 DIAGNOSIS — E669 Obesity, unspecified: Secondary | ICD-10-CM

## 2024-09-25 DIAGNOSIS — Z3A34 34 weeks gestation of pregnancy: Secondary | ICD-10-CM | POA: Diagnosis not present

## 2024-09-25 DIAGNOSIS — Z362 Encounter for other antenatal screening follow-up: Secondary | ICD-10-CM | POA: Insufficient documentation

## 2024-09-25 NOTE — Progress Notes (Signed)
 MFM Consult Note  Nicole Little is currently at [redacted]w[redacted]d. She has been followed due to advanced maternal age (44 years old) and gestational diabetes treated with metformin .    The patient reports that her fingerstick values have mostly been within normal limits, although she did have elevated fingerstick values yesterday.  She denies any problems since her last exam.  Her blood pressure today was 113/61.  Sonographic findings Single intrauterine pregnancy at 34w 5d. Fetal cardiac activity: Observed. Presentation: Breech. Fetal biometry shows the estimated fetal weight of 5 lb 13 oz,  2639g (63%). Amniotic fluid: Within normal limits. AFI: 21.32 cm.  MVP: 5.67 cm. Placenta: Anterior. BPP: 8/8.   She was advised to continue to monitor her fingerstick values on a daily basis.    She should continue to be followed with weekly fetal testing until delivery.    She already has an induction of labor scheduled on October 16, 2024.  She has a BPP scheduled in your office next week.  The patient stated that all of her questions were answered.   All conversations were held with the patient today with the help of a Spanish interpreter.  A total of 20 minutes was spent counseling and coordinating the care for this patient.  Greater than 50% of the time was spent in direct face-to-face contact.

## 2024-10-01 ENCOUNTER — Other Ambulatory Visit: Payer: Self-pay

## 2024-10-01 ENCOUNTER — Ambulatory Visit (INDEPENDENT_AMBULATORY_CARE_PROVIDER_SITE_OTHER)

## 2024-10-01 ENCOUNTER — Other Ambulatory Visit (HOSPITAL_COMMUNITY)
Admission: RE | Admit: 2024-10-01 | Discharge: 2024-10-01 | Disposition: A | Source: Ambulatory Visit | Attending: Family Medicine | Admitting: Family Medicine

## 2024-10-01 ENCOUNTER — Ambulatory Visit: Admitting: Family Medicine

## 2024-10-01 VITALS — BP 118/73 | HR 71 | Wt 182.0 lb

## 2024-10-01 DIAGNOSIS — O24415 Gestational diabetes mellitus in pregnancy, controlled by oral hypoglycemic drugs: Secondary | ICD-10-CM

## 2024-10-01 DIAGNOSIS — Z603 Acculturation difficulty: Secondary | ICD-10-CM | POA: Diagnosis not present

## 2024-10-01 DIAGNOSIS — Z3A35 35 weeks gestation of pregnancy: Secondary | ICD-10-CM | POA: Diagnosis not present

## 2024-10-01 DIAGNOSIS — O0990 Supervision of high risk pregnancy, unspecified, unspecified trimester: Secondary | ICD-10-CM

## 2024-10-01 DIAGNOSIS — Z758 Other problems related to medical facilities and other health care: Secondary | ICD-10-CM

## 2024-10-01 DIAGNOSIS — O0993 Supervision of high risk pregnancy, unspecified, third trimester: Secondary | ICD-10-CM

## 2024-10-01 DIAGNOSIS — Z8759 Personal history of other complications of pregnancy, childbirth and the puerperium: Secondary | ICD-10-CM | POA: Diagnosis not present

## 2024-10-01 DIAGNOSIS — O24410 Gestational diabetes mellitus in pregnancy, diet controlled: Secondary | ICD-10-CM

## 2024-10-01 DIAGNOSIS — O09523 Supervision of elderly multigravida, third trimester: Secondary | ICD-10-CM

## 2024-10-01 NOTE — Progress Notes (Signed)
 "  PRENATAL VISIT NOTE  Subjective:  Nicole Little is a 44 y.o. [phone] at [redacted]w[redacted]d being seen today for ongoing prenatal care.  She is currently monitored for the following issues for this high-risk pregnancy and has History of IUFD; Language barrier; AMA (advanced maternal age) multigravida 35+; Supervision of high risk pregnancy, antepartum; Unwanted fertility; and Gestational diabetes on their problem list.  Patient reports no complaints.  Contractions: Irregular. Vag. Bleeding: None.  Movement: Present. Denies leaking of fluid.   The following portions of the patient's history were reviewed and updated as appropriate: allergies, current medications, past family history, past medical history, past social history, past surgical history and problem list.   Objective:   Vitals:   10/01/24 1125  BP: 118/73  Pulse: 71  Weight: 182 lb (82.6 kg)    Fetal Status:  Fetal Heart Rate (bpm): 130 Fundal Height: 34 cm Movement: Present Presentation: Complete Breech  General: Alert, oriented and cooperative. Patient is in no acute distress.  Skin: Skin is warm and dry. No rash noted.   Cardiovascular: Normal heart rate noted  Respiratory: Normal respiratory effort, no problems with respiration noted  Abdomen: Soft, gravid, appropriate for gestational age.  Pain/Pressure: Present     Pelvic: Cervical exam performed in the presence of a chaperone Dilation: 1 Effacement (%): Thick Station: -4 (Ballotable)  Extremities: Normal range of motion.  Edema: Trace  Mental Status: Normal mood and affect. Normal behavior. Normal judgment and thought content.      04/29/2024    5:24 PM 03/12/2017   12:45 PM 03/08/2017    1:45 PM  Depression screen PHQ 2/9  Decreased Interest 1 0 0  Down, Depressed, Hopeless 0 0 0  PHQ - 2 Score 1 0 0  Altered sleeping 0 0 0  Tired, decreased energy 3 0 0  Change in appetite 0 0 0  Feeling bad or failure about yourself  0 0 0  Trouble concentrating 0 0 0  Moving  slowly or fidgety/restless 0 0 0  Suicidal thoughts 0 0 0  PHQ-9 Score 4  0  0      Data saved with a previous flowsheet row definition        04/29/2024    5:25 PM 03/12/2017   12:45 PM 03/08/2017    1:45 PM 03/01/2017   12:45 PM  GAD 7 : Generalized Anxiety Score  Nervous, Anxious, on Edge 0  0  0  0   Control/stop worrying 0  0  0  0   Worry too much - different things 2  0  0  0   Trouble relaxing 0  0  0  0   Restless 0  0  0  0   Easily annoyed or irritable 0  0  0  0   Afraid - awful might happen 2  0  0  0   Total GAD 7 Score 4 0 0 0     Data saved with a previous flowsheet row definition    Assessment and Plan:  Pregnancy: H3E6886 at [redacted]w[redacted]d 1. Supervision of high risk pregnancy, antepartum (Primary) Cultures today - Strep Gp B Culture+Rflx - GC/Chlamydia probe amp ([place])not at [facility]  2. Language barrier Spanish interpreter: in person used  3. History of IUFD In testing, reassuring today  4. Multigravida of advanced maternal age in third trimester LR NIPT  5. Gestational diabetes mellitus (GDM) in third trimester controlled on oral hypoglycemic drug See log  Continue Metformin  For IOL 2/19 EFW 63%  6. [redacted] weeks gestation of pregnancy   Preterm labor symptoms and general obstetric precautions including but not limited to vaginal bleeding, contractions, leaking of fluid and fetal movement were reviewed in detail with the patient. Please refer to After Visit Summary for other counseling recommendations.   Return in 1 week (on 10/08/2024).  Future Appointments  Date Time Provider Department Center  10/08/2024  8:15 AM Delores Nidia CROME, FNP [facility]  10/08/2024  8:55 AM [facility] US1 [facility]  10/15/2024  1:55 PM Ilean Norleen GAILS, MD [facility]  10/16/2024  6:45 AM MC-LD SCHED ROOM MC-INDC None  10/22/2024 10:55 AM Cleatus Moccasin, MD [facility]    Glenys GORMAN Birk, MD  "

## 2024-10-02 LAB — GC/CHLAMYDIA PROBE AMP (~~LOC~~) NOT AT ARMC
Chlamydia: NEGATIVE
Comment: NEGATIVE
Comment: NORMAL
Neisseria Gonorrhea: NEGATIVE

## 2024-10-08 ENCOUNTER — Other Ambulatory Visit

## 2024-10-08 ENCOUNTER — Encounter: Admitting: Obstetrics and Gynecology

## 2024-10-15 ENCOUNTER — Encounter

## 2024-10-15 ENCOUNTER — Encounter: Admitting: Family Medicine

## 2024-10-16 ENCOUNTER — Inpatient Hospital Stay (HOSPITAL_COMMUNITY)

## 2024-10-16 ENCOUNTER — Inpatient Hospital Stay (HOSPITAL_COMMUNITY): Admission: AD | Admit: 2024-10-16 | Source: Home / Self Care

## 2024-10-22 ENCOUNTER — Encounter: Admitting: Obstetrics and Gynecology
